# Patient Record
Sex: Female | Born: 1990 | Race: Black or African American | Hispanic: No | Marital: Single | Smoking: Never smoker
Health system: Southern US, Community
[De-identification: ages and names within clinical notes are randomized; demographics above are authoritative.]

## PROBLEM LIST (undated history)

## (undated) ENCOUNTER — Inpatient Hospital Stay (HOSPITAL_COMMUNITY): Payer: Self-pay

## (undated) DIAGNOSIS — F419 Anxiety disorder, unspecified: Secondary | ICD-10-CM

## (undated) HISTORY — PX: NO PAST SURGERIES: SHX2092

## (undated) HISTORY — DX: Anxiety disorder, unspecified: F41.9

---

## 2016-09-18 NOTE — L&D Delivery Note (Signed)
Patient complete and pushing. Feet visible at introitus. Pulled x 1 contraction with delivery of feet and hips. Rotated to chest down. Continued pushing led to delivery of shoulders and arms. Head delivered easily to mom's chest. SVD of viable female infant over intact perineum. Infant delivered to mom's abdomen. Delayed cord clamping x 1 minute. Cord clamped x 2, cut. Spontaneous cry heard. Weight and Apgars pending. Cord blood obtained. Placenta delivered with slight cord avulsion and manual extraction done. Uterus firm and minimal blood loss. Appeared intact with 3-VC. LUS cleared of clot Vagina inspected.no lacerations noted. Repaired with Vicryl Rapide EBL: 250 cc Anesthesia: IV push

## 2017-01-27 ENCOUNTER — Emergency Department (HOSPITAL_COMMUNITY): Payer: Self-pay

## 2017-01-27 ENCOUNTER — Emergency Department (HOSPITAL_COMMUNITY)
Admission: EM | Admit: 2017-01-27 | Discharge: 2017-01-28 | Discharge status: Against Medical Advice | Payer: Self-pay | Attending: Emergency Medicine | Admitting: Emergency Medicine

## 2017-01-27 DIAGNOSIS — O26891 Other specified pregnancy related conditions, first trimester: Secondary | ICD-10-CM | POA: Insufficient documentation

## 2017-01-27 DIAGNOSIS — O99341 Other mental disorders complicating pregnancy, first trimester: Secondary | ICD-10-CM | POA: Insufficient documentation

## 2017-01-27 DIAGNOSIS — Z91040 Latex allergy status: Secondary | ICD-10-CM | POA: Insufficient documentation

## 2017-01-27 DIAGNOSIS — R1030 Lower abdominal pain, unspecified: Secondary | ICD-10-CM | POA: Insufficient documentation

## 2017-01-27 DIAGNOSIS — O0281 Inappropriate change in quantitative human chorionic gonadotropin (hCG) in early pregnancy: Secondary | ICD-10-CM | POA: Insufficient documentation

## 2017-01-27 DIAGNOSIS — F410 Panic disorder [episodic paroxysmal anxiety] without agoraphobia: Secondary | ICD-10-CM | POA: Insufficient documentation

## 2017-01-27 LAB — COMPREHENSIVE METABOLIC PANEL
ALK PHOS: 48 U/L (ref 38–126)
ALT: 11 U/L — ABNORMAL LOW (ref 14–54)
ANION GAP: 9 (ref 5–15)
AST: 15 U/L (ref 15–41)
Albumin: 4.5 g/dL (ref 3.5–5.0)
BILIRUBIN TOTAL: 0.8 mg/dL (ref 0.3–1.2)
BUN: 12 mg/dL (ref 6–20)
CALCIUM: 9.4 mg/dL (ref 8.9–10.3)
CO2: 20 mmol/L — ABNORMAL LOW (ref 22–32)
Chloride: 105 mmol/L (ref 101–111)
Creatinine, Ser: 0.6 mg/dL (ref 0.44–1.00)
Glucose, Bld: 80 mg/dL (ref 65–99)
POTASSIUM: 3.5 mmol/L (ref 3.5–5.1)
Sodium: 134 mmol/L — ABNORMAL LOW (ref 135–145)
TOTAL PROTEIN: 7.3 g/dL (ref 6.5–8.1)

## 2017-01-27 LAB — CBC WITH DIFFERENTIAL/PLATELET
BASOS PCT: 0 %
Basophils Absolute: 0 10*3/uL (ref 0.0–0.1)
Eosinophils Absolute: 0.1 10*3/uL (ref 0.0–0.7)
Eosinophils Relative: 1 %
HCT: 31.8 % — ABNORMAL LOW (ref 36.0–46.0)
Hemoglobin: 10.8 g/dL — ABNORMAL LOW (ref 12.0–15.0)
Lymphocytes Relative: 25 %
Lymphs Abs: 1.6 10*3/uL (ref 0.7–4.0)
MCH: 28.3 pg (ref 26.0–34.0)
MCHC: 34 g/dL (ref 30.0–36.0)
MCV: 83.2 fL (ref 78.0–100.0)
MONO ABS: 0.4 10*3/uL (ref 0.1–1.0)
MONOS PCT: 6 %
NEUTROS ABS: 4.4 10*3/uL (ref 1.7–7.7)
Neutrophils Relative %: 68 %
Platelets: 194 10*3/uL (ref 150–400)
RBC: 3.82 MIL/uL — ABNORMAL LOW (ref 3.87–5.11)
RDW: 13.8 % (ref 11.5–15.5)
WBC: 6.4 10*3/uL (ref 4.0–10.5)

## 2017-01-27 LAB — URINALYSIS, ROUTINE W REFLEX MICROSCOPIC
BILIRUBIN URINE: NEGATIVE
GLUCOSE, UA: NEGATIVE mg/dL
Hgb urine dipstick: NEGATIVE
KETONES UR: 20 mg/dL — AB
LEUKOCYTES UA: NEGATIVE
Nitrite: NEGATIVE
PH: 6 (ref 5.0–8.0)
Protein, ur: NEGATIVE mg/dL
Specific Gravity, Urine: 1.005 (ref 1.005–1.030)

## 2017-01-27 LAB — HCG, QUANTITATIVE, PREGNANCY: hCG, Beta Chain, Quant, S: 44345 m[IU]/mL — ABNORMAL HIGH (ref <5)

## 2017-01-27 LAB — WET PREP, GENITAL
Sperm: NONE SEEN
Yeast Wet Prep HPF POC: NONE SEEN

## 2017-01-27 LAB — ABO/RH: ABO/RH(D): B POS

## 2017-01-27 LAB — LIPASE, BLOOD: Lipase: 15 U/L (ref 11–51)

## 2017-01-27 MED ORDER — SODIUM CHLORIDE 0.9 % IV BOLUS (SEPSIS)
1000.0000 mL | Freq: Once | INTRAVENOUS | Status: AC
  Administered 2017-01-27: 1000 mL via INTRAVENOUS

## 2017-01-27 NOTE — ED Provider Notes (Signed)
[facility]-EMERGENCY DEPT Provider Note   CSN: [id number] Arrival date & time: 01/27/17  1935     History   Chief Complaint Chief Complaint  Patient presents with  . Abdominal Pain  . Panic Attack    HPI Mandy Vasquez is a 26 y.o. female.  HPI 26 year old African-American female who is G3 P2 that presents to the emergency department today by EMS with complaints of a anxiety attack while at work preceded by lower abdominal cramping and pressure. Patient states that she was at work today when she developed severe lower abdominal cramping and gas pains which made her very upset and anxious because she was worried about the baby. States that she stood up quickly and had a brief syncopal episode. Denies hitting her head. She was out for a few seconds and regained consciousness. States that she does have a history of anxiety and panic attacks and has had syncopal episodes with these in the past and is followed by her primary care doctor and states that she does need medication for this. The patient reports mild nausea but denies any emesis. States that she has been nauseous with her pregnancy thus far. Reports constipation last bowel movement 1 week ago but endorses passing gas. History of same. States that she did take a pregnancy test last week that was positive. States that she thinks her last menstrual period cycle was the beginning of February but she is not sure. Patient states she is sexually active with last sexual intercourse was last night. States that she has been spotting over the past week but denies any spotting or vaginal discharge at this time. Patient denies any recent illness. She denies any headache, vision changes, fevers, lightheadedness, dizziness, chest pain, shortness of breath, urinary symptoms. Patient has not seen an OB/GYN as of yet. States that her first pregnant was normal. Does report having to be admitted to the ICU for blood and infection during her second pregnancy. No  past medical history on file.  There are no active problems to display for this patient.   No past surgical history on file.  OB History    No data available       Home Medications    Prior to Admission medications   Not on File    Family History No family history on file.  Social History Social History  Substance Use Topics  . Smoking status: Not on file  . Smokeless tobacco: Not on file  . Alcohol use Not on file     Allergies   Latex and Onion   Review of Systems Review of Systems  Constitutional: Negative for chills and fever.  HENT: Negative for congestion.   Eyes: Negative for visual disturbance.  Respiratory: Negative for cough and shortness of breath.   Cardiovascular: Negative for chest pain, palpitations and leg swelling.  Gastrointestinal: Positive for constipation. Negative for blood in stool, diarrhea, nausea and vomiting.  Genitourinary: Positive for vaginal bleeding (spotting). Negative for dysuria, flank pain, frequency, hematuria, urgency and vaginal discharge.  Skin: Negative.   Neurological: Positive for syncope. Negative for dizziness, seizures, weakness, light-headedness, numbness and headaches.  Psychiatric/Behavioral: The patient is nervous/anxious.      Physical Exam Updated Vital Signs BP 115/63 (BP Location: Left Arm)   Pulse 68   Temp 98.5 F (36.9 C) (Oral)   Resp 19   SpO2 100%   Physical Exam  Constitutional: She is oriented to person, place, and time. She appears well-developed and well-nourished. No distress.  Non toxic appearing.   HENT:  Head: Normocephalic and atraumatic.  Mouth/Throat: Oropharynx is clear and moist.  Eyes: Conjunctivae and EOM are normal. Pupils are equal, round, and reactive to light. Right eye exhibits no discharge. Left eye exhibits no discharge. No scleral icterus.  Neck: Normal range of motion. Neck supple. No thyromegaly present.  Cardiovascular: Normal rate, regular rhythm, normal heart  sounds and intact distal pulses.  Exam reveals no gallop and no friction rub.   No murmur heard. Pulmonary/Chest: Effort normal and breath sounds normal. No respiratory distress. She has no wheezes. She has no rales. She exhibits no tenderness.  Abdominal: Soft. Bowel sounds are normal. She exhibits no distension. There is tenderness in the right lower quadrant, suprapubic area and left lower quadrant. There is no rigidity, no rebound, no guarding and no CVA tenderness.  Genitourinary:  Genitourinary Comments: Chaperone present for exam. Small amount of white discharge noted. The patient has tenderness over the bilateral adnexa is cervix. Cervical os is closed. No bleeding noted. Cervix is not friable.  Musculoskeletal: Normal range of motion.  Lymphadenopathy:    She has no cervical adenopathy.  Neurological: She is alert and oriented to person, place, and time.  The patient is alert, attentive, and oriented x 3. Speech is clear. Cranial nerve II-VII grossly intact. Negative pronator drift. Sensation intact. Strength 5/5 in all extremities. Reflexes 2+ and symmetric at biceps, triceps, knees, and ankles. Rapid alternating movement and fine finger movements intact.    Skin: Skin is warm and dry. Capillary refill takes less than 2 seconds.  Nursing note and vitals reviewed.    ED Treatments / Results  Labs (all labs ordered are listed, but only abnormal results are displayed) Labs Reviewed  WET PREP, GENITAL - Abnormal; Notable for the following:       Result Value   Trich, Wet Prep MANY (*)    Clue Cells Wet Prep HPF POC PRESENT (*)    WBC, Wet Prep HPF POC MANY (*)    All other components within normal limits  COMPREHENSIVE METABOLIC PANEL - Abnormal; Notable for the following:    Sodium 134 (*)    CO2 20 (*)    ALT 11 (*)    All other components within normal limits  URINALYSIS, ROUTINE W REFLEX MICROSCOPIC - Abnormal; Notable for the following:    Color, Urine STRAW (*)     Ketones, ur 20 (*)    All other components within normal limits  CBC WITH DIFFERENTIAL/PLATELET - Abnormal; Notable for the following:    RBC 3.82 (*)    Hemoglobin 10.8 (*)    HCT 31.8 (*)    All other components within normal limits  HCG, QUANTITATIVE, PREGNANCY - Abnormal; Notable for the following:    hCG, Beta Chain, Quant, S 44,345 (*)    All other components within normal limits  LIPASE, BLOOD  ABO/RH  GC/CHLAMYDIA PROBE AMP ([place]) NOT AT [facility]    EKG  EKG Interpretation  Date/Time:  Saturday Jan 27 2017 21:38:25 EDT Ventricular Rate:  66 PR Interval:    QRS Duration: 68 QT Interval:  438 QTC Calculation: 459 R Axis:   86 Text Interpretation:  Sinus rhythm Atrial premature complex Probable left atrial enlargement No old tracing to compare Confirmed by BELFI  MD, MELANIE ([id number]) on 01/27/2017 9:48:30 PM       Radiology No results found.  Procedures Procedures (including critical care time)  Medications Ordered in ED Medications  sodium chloride  0.9 % bolus 1,000 mL (0 mLs Intravenous Stopped 01/27/17 2234)     Initial Impression / Assessment and Plan / ED Course  I have reviewed the triage vital signs and the nursing notes.  Pertinent labs & imaging results that were available during my care of the patient were reviewed by me and considered in my medical decision making (see chart for details).     Patient presents to the emergency Department today with complaints of lower abdominal cramping and vaginal spotting over the past week. This was followed by a anxiety/panic attack cultured have a syncopal episode when she stood up quickly at work. Denies head trauma. Patient states that she has a history of anxiety and panic attacks causing her to have a syncopal episodes. Patient seems very concerned about the cramping and bleeding. On exam patient does have a lateral adnexal tenderness and cervical tenderness. No bleeding was noted. She does have tenderness to  lower abdomen on exam. Patient has no leukocytosis. Hemoglobin is 10.8 which patient states has a history of anemia. Do not feel this is causing her symptoms. Electrolytes are normal. Patient is B+ is not a program. UA shows no signs of infection. Wet prep does show trichomoniasis present include cells with many WBCs. GC and chlamydia cultures are pending. The patient needs to be treated with Flagyl however unsure patient has her first or second trimester. Also can't r/o pid but wuld like to wait for results before treating. Pt will need close follow up with ob/'gyn. Awaiting ultrasound results. The patient was given IV fluids. EKG shows no signs of ischemic changes or right heart strain. Patient has no lower extremity edema. Doubt PE. This time patient is awaiting ultrasound results. Sign out to PA Upstill following ultrasound results and reassessment the patient. If ultrasound is normal with live intrauterine pregnancy feel the patient needs close follow-up with OB/GYN and can be discharged with strict return precautions. Dicussed with Dr. Fredderick Phenix who is agreeable. Patient is currently hemodynamically stable. Vital signs are normal. No hypotension.  Final Clinical Impressions(s) / ED Diagnoses   Final diagnoses:  None    New Prescriptions New Prescriptions   No medications on file     Wallace Keller 01/27/17 2356    Rolan Bucco, MD 01/28/17 [phone]

## 2017-01-27 NOTE — ED Triage Notes (Addendum)
Per  EMS with complaint of panic attack/anxiety while at work today  and abdominal pain x1 week . Pt. [redacted] weeks pregnant ,also reported of random spotting  over a week now. Pain at R/L  Lower quadrant. G3P2. Reported of N/V prior to coming to ED. Pt. Also report of constipation , last BM a week and half ago.

## 2017-01-27 NOTE — ED Notes (Signed)
Bed: WA21 Expected date:  Expected time:  Means of arrival:  Comments: 26 yo pregnant/abd pain x1 week

## 2017-01-27 NOTE — ED Provider Notes (Cosign Needed)
?  12-week pregnant with lower abd cramping started today G3P2 Spotting in the last week - none today Passed out today with standing up H/O anxiety - ? Vasovagal response Today has tenderness throughout pelvis - Hgb 10.8, no leukocytosis Trich positive - GC/chlamydia pending Quant 44000 - US pending  2:15 - Patient appears to have left the department without informing staff. IV was removed during encounter by patient request secondary to discomfort. US incomplete prior to her departure. AMA disposition set.     Elpidio AnisUpstill, Belkis Norbeck, PA-C 01/28/17 0216

## 2017-01-28 MED ORDER — ACETAMINOPHEN 325 MG PO TABS
650.0000 mg | ORAL_TABLET | Freq: Once | ORAL | Status: AC
  Administered 2017-01-28: 650 mg via ORAL
  Filled 2017-01-28: qty 2

## 2017-01-28 NOTE — ED Notes (Signed)
Pt. Left AMA. Melvenia BeamShari ,PA notified.

## 2017-01-29 LAB — GC/CHLAMYDIA PROBE AMP (~~LOC~~) NOT AT ARMC
CHLAMYDIA, DNA PROBE: NEGATIVE
Neisseria Gonorrhea: NEGATIVE

## 2017-03-16 ENCOUNTER — Encounter: Payer: Self-pay | Admitting: Obstetrics

## 2017-04-02 ENCOUNTER — Other Ambulatory Visit (HOSPITAL_COMMUNITY)
Admission: RE | Admit: 2017-04-02 | Discharge: 2017-04-02 | Disposition: A | Discharge status: Home / Self Care | Payer: Medicaid Other | Source: Ambulatory Visit | Attending: Certified Nurse Midwife | Admitting: Certified Nurse Midwife

## 2017-04-02 ENCOUNTER — Encounter: Payer: Self-pay | Admitting: Certified Nurse Midwife

## 2017-04-02 ENCOUNTER — Ambulatory Visit (INDEPENDENT_AMBULATORY_CARE_PROVIDER_SITE_OTHER): Payer: Medicaid Other | Admitting: Certified Nurse Midwife

## 2017-04-02 VITALS — BP 107/69 | HR 58 | Ht 70.0 in | Wt 128.8 lb

## 2017-04-02 DIAGNOSIS — O09899 Supervision of other high risk pregnancies, unspecified trimester: Secondary | ICD-10-CM | POA: Insufficient documentation

## 2017-04-02 DIAGNOSIS — Z3A23 23 weeks gestation of pregnancy: Secondary | ICD-10-CM | POA: Insufficient documentation

## 2017-04-02 DIAGNOSIS — O09212 Supervision of pregnancy with history of pre-term labor, second trimester: Secondary | ICD-10-CM | POA: Diagnosis not present

## 2017-04-02 DIAGNOSIS — Z3482 Encounter for supervision of other normal pregnancy, second trimester: Secondary | ICD-10-CM | POA: Insufficient documentation

## 2017-04-02 DIAGNOSIS — O0930 Supervision of pregnancy with insufficient antenatal care, unspecified trimester: Secondary | ICD-10-CM | POA: Insufficient documentation

## 2017-04-02 DIAGNOSIS — O0990 Supervision of high risk pregnancy, unspecified, unspecified trimester: Secondary | ICD-10-CM | POA: Insufficient documentation

## 2017-04-02 DIAGNOSIS — O09219 Supervision of pregnancy with history of pre-term labor, unspecified trimester: Principal | ICD-10-CM

## 2017-04-02 DIAGNOSIS — O0932 Supervision of pregnancy with insufficient antenatal care, second trimester: Secondary | ICD-10-CM

## 2017-04-02 DIAGNOSIS — Z3480 Encounter for supervision of other normal pregnancy, unspecified trimester: Secondary | ICD-10-CM

## 2017-04-02 MED ORDER — PROGESTERONE MICRONIZED 200 MG PO CAPS: 200.0000 mg | ORAL_CAPSULE | Freq: Every day | ORAL | 1 refills | Status: DC

## 2017-04-02 NOTE — Progress Notes (Signed)
Patient is in the office for initial ob visit, reports good fetal movement, denies pain today, but states that she has been experiencing irregular contractions when she is on her feet at work.

## 2017-04-02 NOTE — Progress Notes (Signed)
Subjective:  Mandy Vasquez is a 26 y.o. [id number] at 4184w4d being seen today for first prenatal care visit.  She is currently monitored for the following issues for this low-risk pregnancy and has Supervision of other normal pregnancy, antepartum; History of preterm delivery, currently pregnant; and Late prenatal care affecting pregnancy on her problem list.  Patient reports no complaints.  Contractions: Not present. Vag. Bleeding: None.  Movement: Present. Denies leaking of fluid.   The following portions of the patient's history were reviewed and updated as appropriate: allergies, current medications, past family history, past medical history, past social history, past surgical history and problem list. Problem list updated.  Objective:   Vitals:   04/02/17 1023 04/02/17 1026  BP: 107/69   Pulse: (!) 58   Weight: 128 lb 12.8 oz (58.4 kg)   Height:  5\' 10"  (1.778 m)    Fetal Status:     Movement: Present     General:  Alert, oriented and cooperative. Patient is in no acute distress.  Skin: Skin is warm and dry. No rash noted.   Cardiovascular: Normal heart rate noted  Respiratory: Normal respiratory effort, no problems with respiration noted  Abdomen: Soft, gravid, appropriate for gestational age. Pain/Pressure: Absent     Pelvic: Vag. Bleeding: None     Cervical exam performed        Extremities: Normal range of motion.  Edema: None  Mental Status: Normal mood and affect. Normal behavior. Normal judgment and thought content.   Urinalysis:      Assessment and Plan:  Pregnancy: [id number] at 5184w4d  1. Supervision of other normal pregnancy, antepartum - Cytology - PAP - Cervicovaginal ancillary only - Obstetric Panel, Including HIV - Vitamin D (25 hydroxy) - Hemoglobin A1c - Hemoglobinopathy evaluation - Varicella zoster antibody, IgG - Culture, OB Urine - US MFM OB DETAIL +14 WK; Future  2. History of preterm delivery, currently pregnant - 34 wks SROM >PTD - start vaginal  Progesterone  3. Late prenatal care affecting pregnancy in second trimester   Preterm labor symptoms and general obstetric precautions including but not limited to vaginal bleeding, contractions, leaking of fluid and fetal movement were reviewed in detail with the patient. Please refer to After Visit Summary for other counseling recommendations.  Return in about 4 weeks (around 04/30/2017).   Donette LarryBhambri, Mandy Vasquez, CNM

## 2017-04-02 NOTE — Addendum Note (Signed)
Addended by: Natale MilchSTALLING, Takeia Ciaravino D on: 04/02/2017 02:13 PM   Modules accepted: Orders

## 2017-04-03 LAB — CERVICOVAGINAL ANCILLARY ONLY
Bacterial vaginitis: POSITIVE — AB
Candida vaginitis: POSITIVE — AB
Chlamydia: NEGATIVE
NEISSERIA GONORRHEA: NEGATIVE
TRICH (WINDOWPATH): POSITIVE — AB

## 2017-04-03 LAB — CYTOLOGY - PAP
DIAGNOSIS: NEGATIVE
HPV (WINDOPATH): NOT DETECTED

## 2017-04-04 LAB — URINE CULTURE, OB REFLEX

## 2017-04-04 LAB — CULTURE, OB URINE

## 2017-04-05 ENCOUNTER — Telehealth: Payer: Self-pay | Admitting: *Deleted

## 2017-04-05 NOTE — Telephone Encounter (Signed)
LM on VM to CB re: lab results. 

## 2017-04-05 NOTE — Telephone Encounter (Signed)
-----   Message from Donette LarryMelanie Bhambri, [place] sent at 04/04/2017  8:59 AM EDT ----- Regarding: results +BV, yeast, and trich. Please notify pt and send Flagyl 2gm x1 and Terazol 7. Needs to abstain from sex for 2 weeks and partner needs to seek treatment. Thanks.

## 2017-04-05 NOTE — Telephone Encounter (Signed)
LM on VM to CB re: medication sent to pharmacy and instruction on usage.

## 2017-04-05 NOTE — Telephone Encounter (Signed)
-----   Message from Donette LarryMelanie Bhambri, [place] sent at 04/02/2017  1:08 PM EDT ----- Regarding: Prometrium Please inform patient that I recommend vaginal progesterone to decrease her risk of another preterm delivery. She should use it nightly from now until 36 weeks. I sent Rx. Thanks.

## 2017-04-06 LAB — OBSTETRIC PANEL, INCLUDING HIV
ANTIBODY SCREEN: NEGATIVE
BASOS ABS: 0 10*3/uL (ref 0.0–0.2)
BASOS: 0 %
EOS (ABSOLUTE): 0.2 10*3/uL (ref 0.0–0.4)
Eos: 2 %
HEMATOCRIT: 36.1 % (ref 34.0–46.6)
HIV SCREEN 4TH GENERATION: NONREACTIVE
Hemoglobin: 11 g/dL — ABNORMAL LOW (ref 11.1–15.9)
Hepatitis B Surface Ag: NEGATIVE
Immature Grans (Abs): 0 10*3/uL (ref 0.0–0.1)
Immature Granulocytes: 0 %
Lymphocytes Absolute: 2 10*3/uL (ref 0.7–3.1)
Lymphs: 26 %
MCH: 26.7 pg (ref 26.6–33.0)
MCHC: 30.5 g/dL — AB (ref 31.5–35.7)
MCV: 88 fL (ref 79–97)
MONOCYTES: 7 %
MONOS ABS: 0.5 10*3/uL (ref 0.1–0.9)
Neutrophils Absolute: 5 10*3/uL (ref 1.4–7.0)
Neutrophils: 65 %
PLATELETS: 247 10*3/uL (ref 150–379)
RBC: 4.12 x10E6/uL (ref 3.77–5.28)
RDW: 13.6 % (ref 12.3–15.4)
RPR Ser Ql: NONREACTIVE
RUBELLA: 1.64 {index} (ref >0.99)
Rh Factor: POSITIVE
WBC: 7.7 10*3/uL (ref 3.4–10.8)

## 2017-04-06 LAB — HEMOGLOBINOPATHY EVALUATION
HEMOGLOBIN A2 QUANTITATION: 2.3 % (ref 1.8–3.2)
HGB C: 0 %
HGB S: 0 %
HGB VARIANT: 0 %
Hemoglobin F Quantitation: 0 % (ref 0.0–2.0)
Hgb A: 97.7 % (ref 96.4–98.8)

## 2017-04-06 LAB — HEMOGLOBIN A1C
Est. average glucose Bld gHb Est-mCnc: 82 mg/dL
Hgb A1c MFr Bld: 4.5 % — ABNORMAL LOW (ref 4.8–5.6)

## 2017-04-06 LAB — VITAMIN D 25 HYDROXY (VIT D DEFICIENCY, FRACTURES): VIT D 25 HYDROXY: 31.7 ng/mL (ref 30.0–100.0)

## 2017-04-06 LAB — VARICELLA ZOSTER ANTIBODY, IGG: VARICELLA: 2695 {index} (ref >165)

## 2017-04-11 LAB — CYSTIC FIBROSIS MUTATION 97: Interpretation: NOT DETECTED

## 2017-04-12 ENCOUNTER — Other Ambulatory Visit: Payer: Self-pay | Admitting: Certified Nurse Midwife

## 2017-04-12 ENCOUNTER — Ambulatory Visit (HOSPITAL_COMMUNITY)
Admission: RE | Admit: 2017-04-12 | Discharge: 2017-04-12 | Disposition: A | Discharge status: Home / Self Care | Payer: Medicaid Other | Source: Ambulatory Visit | Attending: Certified Nurse Midwife | Admitting: Certified Nurse Midwife

## 2017-04-12 ENCOUNTER — Other Ambulatory Visit: Payer: Medicaid Other

## 2017-04-12 DIAGNOSIS — O23599 Infection of other part of genital tract in pregnancy, unspecified trimester: Secondary | ICD-10-CM

## 2017-04-12 DIAGNOSIS — O321XX0 Maternal care for breech presentation, not applicable or unspecified: Secondary | ICD-10-CM | POA: Diagnosis not present

## 2017-04-12 DIAGNOSIS — Z3A25 25 weeks gestation of pregnancy: Secondary | ICD-10-CM

## 2017-04-12 DIAGNOSIS — O09213 Supervision of pregnancy with history of pre-term labor, third trimester: Secondary | ICD-10-CM | POA: Diagnosis not present

## 2017-04-12 DIAGNOSIS — Z3492 Encounter for supervision of normal pregnancy, unspecified, second trimester: Secondary | ICD-10-CM

## 2017-04-12 DIAGNOSIS — Z3480 Encounter for supervision of other normal pregnancy, unspecified trimester: Secondary | ICD-10-CM | POA: Diagnosis present

## 2017-04-12 DIAGNOSIS — Z363 Encounter for antenatal screening for malformations: Secondary | ICD-10-CM

## 2017-04-12 DIAGNOSIS — Z3A22 22 weeks gestation of pregnancy: Secondary | ICD-10-CM | POA: Diagnosis not present

## 2017-04-12 DIAGNOSIS — A5901 Trichomonal vulvovaginitis: Secondary | ICD-10-CM

## 2017-04-12 DIAGNOSIS — O23592 Infection of other part of genital tract in pregnancy, second trimester: Secondary | ICD-10-CM

## 2017-04-12 DIAGNOSIS — Z8751 Personal history of pre-term labor: Secondary | ICD-10-CM

## 2017-04-12 MED ORDER — METRONIDAZOLE 500 MG PO TABS: ORAL_TABLET | ORAL | 0 refills | Status: DC

## 2017-04-12 MED ORDER — TERCONAZOLE 0.4 % VA CREA: 1.0000 | TOPICAL_CREAM | Freq: Every day | VAGINAL | 0 refills | Status: DC

## 2017-04-12 NOTE — Addendum Note (Signed)
Addended by: Donette LarryBHAMBRI, Joclynn Lumb E on: 04/12/2017 12:29 PM   Modules accepted: Kipp BroodSmartSet

## 2017-04-12 NOTE — Progress Notes (Signed)
Patient came in the office, notified of lab results and rx sent, pt was extremely upset about results and stated that she would call back to re-schedule appt for glucose test, due to vomiting.

## 2017-04-13 LAB — CBC
HEMOGLOBIN: 11.1 g/dL (ref 11.1–15.9)
Hematocrit: 35.8 % (ref 34.0–46.6)
MCH: 28 pg (ref 26.6–33.0)
MCHC: 31 g/dL — AB (ref 31.5–35.7)
MCV: 90 fL (ref 79–97)
Platelets: 203 10*3/uL (ref 150–379)
RBC: 3.97 x10E6/uL (ref 3.77–5.28)
RDW: 13.8 % (ref 12.3–15.4)
WBC: 8.5 10*3/uL (ref 3.4–10.8)

## 2017-04-13 LAB — HIV ANTIBODY (ROUTINE TESTING W REFLEX): HIV SCREEN 4TH GENERATION: NONREACTIVE

## 2017-04-13 LAB — RPR: RPR Ser Ql: NONREACTIVE

## 2017-04-13 NOTE — Telephone Encounter (Signed)
Patient notified at her appointment in office 7/26

## 2017-04-25 ENCOUNTER — Other Ambulatory Visit (INDEPENDENT_AMBULATORY_CARE_PROVIDER_SITE_OTHER): Payer: Medicaid Other | Admitting: Certified Nurse Midwife

## 2017-04-25 DIAGNOSIS — O99612 Diseases of the digestive system complicating pregnancy, second trimester: Secondary | ICD-10-CM

## 2017-04-25 DIAGNOSIS — O99342 Other mental disorders complicating pregnancy, second trimester: Secondary | ICD-10-CM

## 2017-04-25 DIAGNOSIS — O219 Vomiting of pregnancy, unspecified: Secondary | ICD-10-CM

## 2017-04-25 DIAGNOSIS — O09212 Supervision of pregnancy with history of pre-term labor, second trimester: Secondary | ICD-10-CM

## 2017-04-25 DIAGNOSIS — F419 Anxiety disorder, unspecified: Secondary | ICD-10-CM

## 2017-04-25 DIAGNOSIS — K219 Gastro-esophageal reflux disease without esophagitis: Secondary | ICD-10-CM

## 2017-04-25 DIAGNOSIS — O99340 Other mental disorders complicating pregnancy, unspecified trimester: Secondary | ICD-10-CM

## 2017-04-25 DIAGNOSIS — O09899 Supervision of other high risk pregnancies, unspecified trimester: Secondary | ICD-10-CM

## 2017-04-25 DIAGNOSIS — O09219 Supervision of pregnancy with history of pre-term labor, unspecified trimester: Principal | ICD-10-CM

## 2017-04-25 MED ORDER — PRENATE PIXIE 10-0.6-0.4-200 MG PO CAPS: 1.0000 | ORAL_CAPSULE | Freq: Every day | ORAL | 12 refills | Status: DC

## 2017-04-25 MED ORDER — OMEPRAZOLE 20 MG PO CPDR: 20.0000 mg | DELAYED_RELEASE_CAPSULE | Freq: Two times a day (BID) | ORAL | 5 refills | Status: DC

## 2017-04-25 MED ORDER — HYDROXYZINE HCL 25 MG PO TABS: 25.0000 mg | ORAL_TABLET | Freq: Four times a day (QID) | ORAL | 3 refills | Status: DC | PRN

## 2017-04-25 MED ORDER — DOXYLAMINE-PYRIDOXINE ER 20-20 MG PO TBCR: 1.0000 | EXTENDED_RELEASE_TABLET | Freq: Two times a day (BID) | ORAL | 6 refills | Status: DC

## 2017-04-25 MED ORDER — HYDROXYPROGESTERONE CAPROATE 275 MG/1.1ML ~~LOC~~ SOAJ: 275.0000 mg | Freq: Once | SUBCUTANEOUS | Status: DC

## 2017-04-25 MED ORDER — HYDROXYPROGESTERONE CAPROATE 250 MG/ML IM OIL
250.0000 mg | TOPICAL_OIL | Freq: Once | INTRAMUSCULAR | Status: AC
  Administered 2017-04-25: 250 mg via INTRAMUSCULAR

## 2017-04-25 MED ORDER — ONDANSETRON 4 MG PO TBDP: 8.0000 mg | ORAL_TABLET | Freq: Four times a day (QID) | ORAL | 4 refills | Status: AC | PRN

## 2017-04-25 NOTE — Progress Notes (Signed)
Patient is in the office for second attempt glucose testing. She started vomiting and shaking after attempting her glucose test. She was taken to an exam room for monitoring and rest after she was able to ambulate. She was having chest pain which she stated she has with her anxiety attacks. BP reading 90/50 O2 sat 99 BP reading 87/59 Patient is resting drinking orange juice and eating crackers. She feels sure that her chest pain was from her anxiety- she has had this before. BP reading 91/60 P 68 O2 sat 99

## 2017-04-26 LAB — GLUCOSE, RANDOM: Glucose: 68 mg/dL (ref 65–99)

## 2017-04-27 NOTE — Progress Notes (Signed)
S: patient improved after an hour of resting, denies any chest pain currently.  O: incomplete second attempt at 2 hour OGTT A: unable to tolerate 2 hour OGTT, needs 1 hour jelly bean test P: f/u 1 hour jelly bean test at 28 weeks. Start 17-P for hx of PTD.

## 2017-04-30 ENCOUNTER — Other Ambulatory Visit: Payer: Self-pay | Admitting: *Deleted

## 2017-04-30 DIAGNOSIS — O219 Vomiting of pregnancy, unspecified: Secondary | ICD-10-CM

## 2017-04-30 MED ORDER — DOXYLAMINE-PYRIDOXINE 10-10 MG PO TBEC: 1.0000 | DELAYED_RELEASE_TABLET | Freq: Three times a day (TID) | ORAL | 99 refills | Status: DC

## 2017-05-02 ENCOUNTER — Encounter: Payer: Medicaid Other | Admitting: Obstetrics & Gynecology

## 2017-05-14 ENCOUNTER — Other Ambulatory Visit: Payer: Medicaid Other

## 2017-05-14 ENCOUNTER — Encounter: Payer: Medicaid Other | Admitting: Advanced Practice Midwife

## 2017-06-05 ENCOUNTER — Telehealth: Payer: Self-pay | Admitting: *Deleted

## 2017-06-05 NOTE — Telephone Encounter (Signed)
Left vmail for patient to call back to schedule appt.

## 2017-06-08 ENCOUNTER — Other Ambulatory Visit: Payer: Self-pay

## 2017-07-31 ENCOUNTER — Inpatient Hospital Stay (HOSPITAL_COMMUNITY)
Admission: AD | Admit: 2017-07-31 | Discharge: 2017-08-02 | DRG: 807 | Disposition: A | Discharge status: Home / Self Care | Payer: Medicaid Other | Source: Ambulatory Visit | Attending: Family Medicine | Admitting: Family Medicine

## 2017-07-31 ENCOUNTER — Other Ambulatory Visit: Payer: Self-pay

## 2017-07-31 ENCOUNTER — Encounter (HOSPITAL_COMMUNITY): Payer: Self-pay | Admitting: *Deleted

## 2017-07-31 DIAGNOSIS — O9089 Other complications of the puerperium, not elsewhere classified: Secondary | ICD-10-CM | POA: Diagnosis not present

## 2017-07-31 DIAGNOSIS — Z3A38 38 weeks gestation of pregnancy: Secondary | ICD-10-CM | POA: Diagnosis not present

## 2017-07-31 DIAGNOSIS — R51 Headache: Secondary | ICD-10-CM | POA: Diagnosis not present

## 2017-07-31 DIAGNOSIS — O321XX0 Maternal care for breech presentation, not applicable or unspecified: Principal | ICD-10-CM | POA: Diagnosis present

## 2017-07-31 DIAGNOSIS — O321XX1 Maternal care for breech presentation, fetus 1: Secondary | ICD-10-CM

## 2017-07-31 DIAGNOSIS — O0990 Supervision of high risk pregnancy, unspecified, unspecified trimester: Secondary | ICD-10-CM

## 2017-07-31 DIAGNOSIS — O09219 Supervision of pregnancy with history of pre-term labor, unspecified trimester: Secondary | ICD-10-CM

## 2017-07-31 DIAGNOSIS — Z91040 Latex allergy status: Secondary | ICD-10-CM

## 2017-07-31 DIAGNOSIS — Z3483 Encounter for supervision of other normal pregnancy, third trimester: Secondary | ICD-10-CM | POA: Diagnosis present

## 2017-07-31 DIAGNOSIS — O09899 Supervision of other high risk pregnancies, unspecified trimester: Secondary | ICD-10-CM

## 2017-07-31 DIAGNOSIS — O0932 Supervision of pregnancy with insufficient antenatal care, second trimester: Secondary | ICD-10-CM

## 2017-07-31 LAB — CBC
HCT: 33.7 % — ABNORMAL LOW (ref 36.0–46.0)
Hemoglobin: 11.2 g/dL — ABNORMAL LOW (ref 12.0–15.0)
MCH: 28.6 pg (ref 26.0–34.0)
MCHC: 33.2 g/dL (ref 30.0–36.0)
MCV: 86.2 fL (ref 78.0–100.0)
PLATELETS: 201 10*3/uL (ref 150–400)
RBC: 3.91 MIL/uL (ref 3.87–5.11)
RDW: 13.3 % (ref 11.5–15.5)
WBC: 9.9 10*3/uL (ref 4.0–10.5)

## 2017-07-31 LAB — TYPE AND SCREEN
ABO/RH(D): B POS
Antibody Screen: NEGATIVE

## 2017-07-31 LAB — RPR: RPR: NONREACTIVE

## 2017-07-31 LAB — POCT FERN TEST: POCT FERN TEST: POSITIVE

## 2017-07-31 LAB — ABO/RH: ABO/RH(D): B POS

## 2017-07-31 MED ORDER — LACTATED RINGERS IV SOLN
INTRAVENOUS | Status: DC
  Administered 2017-07-31: 01:00:00 via INTRAVENOUS

## 2017-07-31 MED ORDER — FLEET ENEMA 7-19 GM/118ML RE ENEM: 1.0000 | ENEMA | RECTAL | Status: DC | PRN

## 2017-07-31 MED ORDER — PHENYLEPHRINE 40 MCG/ML (10ML) SYRINGE FOR IV PUSH (FOR BLOOD PRESSURE SUPPORT)
80.0000 ug | PREFILLED_SYRINGE | INTRAVENOUS | Status: DC | PRN
  Filled 2017-07-31: qty 5

## 2017-07-31 MED ORDER — OXYCODONE HCL 5 MG PO TABS
5.0000 mg | ORAL_TABLET | ORAL | Status: DC | PRN
  Administered 2017-07-31 – 2017-08-01 (×7): 5 mg via ORAL
  Filled 2017-07-31 (×7): qty 1

## 2017-07-31 MED ORDER — OXYTOCIN 40 UNITS IN LACTATED RINGERS INFUSION - SIMPLE MED
2.5000 [IU]/h | INTRAVENOUS | Status: DC
  Administered 2017-07-31: 2.5 [IU]/h via INTRAVENOUS

## 2017-07-31 MED ORDER — EPHEDRINE 5 MG/ML INJ
10.0000 mg | INTRAVENOUS | Status: DC | PRN
  Filled 2017-07-31: qty 2

## 2017-07-31 MED ORDER — FENTANYL 2.5 MCG/ML BUPIVACAINE 1/10 % EPIDURAL INFUSION (WH - ANES)
INTRAMUSCULAR | Status: AC
  Filled 2017-07-31: qty 100

## 2017-07-31 MED ORDER — SIMETHICONE 80 MG PO CHEW: 80.0000 mg | CHEWABLE_TABLET | ORAL | Status: DC | PRN

## 2017-07-31 MED ORDER — OXYTOCIN 40 UNITS IN LACTATED RINGERS INFUSION - SIMPLE MED
INTRAVENOUS | Status: AC
  Filled 2017-07-31: qty 1000

## 2017-07-31 MED ORDER — PRENATAL MULTIVITAMIN CH
1.0000 | ORAL_TABLET | Freq: Every day | ORAL | Status: DC
Start: 2017-07-31 — End: 2017-07-31

## 2017-07-31 MED ORDER — DIBUCAINE 1 % RE OINT: 1.0000 "application " | TOPICAL_OINTMENT | RECTAL | Status: DC | PRN

## 2017-07-31 MED ORDER — LACTATED RINGERS IV SOLN: 500.0000 mL | INTRAVENOUS | Status: DC | PRN

## 2017-07-31 MED ORDER — PRENATAL MULTIVITAMIN CH
1.0000 | ORAL_TABLET | Freq: Every day | ORAL | Status: DC
  Administered 2017-07-31 – 2017-08-01 (×2): 1 via ORAL
  Filled 2017-07-31 (×3): qty 1

## 2017-07-31 MED ORDER — DIPHENHYDRAMINE HCL 25 MG PO CAPS
25.0000 mg | ORAL_CAPSULE | Freq: Four times a day (QID) | ORAL | Status: DC | PRN
Start: 2017-07-31 — End: 2017-08-02

## 2017-07-31 MED ORDER — PHENYLEPHRINE 40 MCG/ML (10ML) SYRINGE FOR IV PUSH (FOR BLOOD PRESSURE SUPPORT)
PREFILLED_SYRINGE | INTRAVENOUS | Status: AC
  Filled 2017-07-31: qty 10

## 2017-07-31 MED ORDER — ACETAMINOPHEN 325 MG PO TABS
650.0000 mg | ORAL_TABLET | ORAL | Status: DC | PRN
Start: 2017-07-31 — End: 2017-08-02
  Administered 2017-07-31 (×2): 650 mg via ORAL
  Filled 2017-07-31 (×2): qty 2

## 2017-07-31 MED ORDER — ONDANSETRON HCL 4 MG PO TABS: 4.0000 mg | ORAL_TABLET | ORAL | Status: DC | PRN

## 2017-07-31 MED ORDER — DIPHENHYDRAMINE HCL 50 MG/ML IJ SOLN: 12.5000 mg | INTRAMUSCULAR | Status: DC | PRN

## 2017-07-31 MED ORDER — COCONUT OIL OIL
1.0000 "application " | TOPICAL_OIL | Status: DC | PRN
  Administered 2017-07-31: 1 via TOPICAL
  Filled 2017-07-31: qty 120

## 2017-07-31 MED ORDER — ONDANSETRON HCL 4 MG/2ML IJ SOLN: 4.0000 mg | INTRAMUSCULAR | Status: DC | PRN

## 2017-07-31 MED ORDER — FENTANYL CITRATE (PF) 100 MCG/2ML IJ SOLN
INTRAMUSCULAR | Status: AC
  Filled 2017-07-31: qty 2

## 2017-07-31 MED ORDER — SOD CITRATE-CITRIC ACID 500-334 MG/5ML PO SOLN: 30.0000 mL | ORAL | Status: DC | PRN

## 2017-07-31 MED ORDER — SENNOSIDES-DOCUSATE SODIUM 8.6-50 MG PO TABS
2.0000 | ORAL_TABLET | ORAL | Status: DC
Start: 2017-08-01 — End: 2017-08-02
  Administered 2017-07-31: 2 via ORAL
  Filled 2017-07-31 (×2): qty 2

## 2017-07-31 MED ORDER — LIDOCAINE HCL (PF) 1 % IJ SOLN
INTRAMUSCULAR | Status: AC
  Filled 2017-07-31: qty 30

## 2017-07-31 MED ORDER — TETANUS-DIPHTH-ACELL PERTUSSIS 5-2.5-18.5 LF-MCG/0.5 IM SUSP: 0.5000 mL | Freq: Once | INTRAMUSCULAR | Status: DC

## 2017-07-31 MED ORDER — FENTANYL CITRATE (PF) 100 MCG/2ML IJ SOLN
50.0000 ug | Freq: Once | INTRAMUSCULAR | Status: AC
  Administered 2017-07-31: 50 ug via INTRAVENOUS

## 2017-07-31 MED ORDER — OXYCODONE-ACETAMINOPHEN 5-325 MG PO TABS: 2.0000 | ORAL_TABLET | ORAL | Status: DC | PRN

## 2017-07-31 MED ORDER — OXYCODONE-ACETAMINOPHEN 5-325 MG PO TABS: 1.0000 | ORAL_TABLET | ORAL | Status: DC | PRN

## 2017-07-31 MED ORDER — MEASLES, MUMPS & RUBELLA VAC ~~LOC~~ INJ
0.5000 mL | INJECTION | Freq: Once | SUBCUTANEOUS | Status: DC
  Filled 2017-07-31: qty 0.5

## 2017-07-31 MED ORDER — OXYTOCIN BOLUS FROM INFUSION
500.0000 mL | Freq: Once | INTRAVENOUS | Status: AC
  Administered 2017-07-31: 500 mL via INTRAVENOUS

## 2017-07-31 MED ORDER — FENTANYL 2.5 MCG/ML BUPIVACAINE 1/10 % EPIDURAL INFUSION (WH - ANES): 14.0000 mL/h | INTRAMUSCULAR | Status: DC | PRN

## 2017-07-31 MED ORDER — WITCH HAZEL-GLYCERIN EX PADS: 1.0000 "application " | MEDICATED_PAD | CUTANEOUS | Status: DC | PRN

## 2017-07-31 MED ORDER — ONDANSETRON HCL 4 MG/2ML IJ SOLN: 4.0000 mg | Freq: Four times a day (QID) | INTRAMUSCULAR | Status: DC | PRN

## 2017-07-31 MED ORDER — PRENATAL 27-0.8 MG PO TABS: 1.0000 | ORAL_TABLET | Freq: Every day | ORAL | Status: DC

## 2017-07-31 MED ORDER — BENZOCAINE-MENTHOL 20-0.5 % EX AERO: 1.0000 "application " | INHALATION_SPRAY | CUTANEOUS | Status: DC | PRN

## 2017-07-31 MED ORDER — IBUPROFEN 600 MG PO TABS
600.0000 mg | ORAL_TABLET | Freq: Four times a day (QID) | ORAL | Status: DC
  Administered 2017-07-31 – 2017-08-02 (×9): 600 mg via ORAL
  Filled 2017-07-31 (×10): qty 1

## 2017-07-31 MED ORDER — ACETAMINOPHEN 325 MG PO TABS: 650.0000 mg | ORAL_TABLET | ORAL | Status: DC | PRN

## 2017-07-31 MED ORDER — LIDOCAINE HCL (PF) 1 % IJ SOLN
30.0000 mL | INTRAMUSCULAR | Status: DC | PRN
  Filled 2017-07-31: qty 30

## 2017-07-31 MED ORDER — LACTATED RINGERS IV SOLN
500.0000 mL | Freq: Once | INTRAVENOUS | Status: AC
  Administered 2017-07-31: 500 mL via INTRAVENOUS

## 2017-07-31 MED ORDER — ZOLPIDEM TARTRATE 5 MG PO TABS: 5.0000 mg | ORAL_TABLET | Freq: Every evening | ORAL | Status: DC | PRN

## 2017-07-31 NOTE — H&P (Signed)
Mandy Vasquez is an 26 y.o. [phone] 6074w3d female.   Chief Complaint: labor HPI: labor began at 349. Very uncomfortable. Found to be 8-9 and breech in triage. P2. Limited PNC.  Past Medical History:  Diagnosis Date  . Anxiety     No past surgical history on file.  Family History  Problem Relation Age of Onset  . Diabetes Maternal Aunt   . Heart disease Maternal Aunt   . Diabetes Maternal Grandmother   . Heart disease Maternal Grandmother    Social History:  reports that  has never smoked. she has never used smokeless tobacco. She reports that she does not drink alcohol or use drugs.    Allergies  Allergen Reactions  . Latex   . Onion     Medications Prior to Admission  Medication Sig Dispense Refill  . Prenatal Vit-Fe Fumarate-FA (MULTIVITAMIN-PRENATAL) 27-0.8 MG TABS tablet Take 1 tablet by mouth daily at 12 noon.    . Doxylamine-Pyridoxine (DICLEGIS) 10-10 MG TBEC Take 1 tablet by mouth 3 (three) times daily. 1 tab in AM, 1 tab mid afternoon 2 tabs at bedtime. Max dose 4 tabs daily. 100 tablet prn  . hydrOXYzine (ATARAX/VISTARIL) 25 MG tablet Take 1 tablet (25 mg total) by mouth every 6 (six) hours as needed for itching. 30 tablet 3  . metroNIDAZOLE (FLAGYL) 500 MG tablet Take 4 tablets by mouth once. 4 tablet 0  . omeprazole (PRILOSEC) 20 MG capsule Take 1 capsule (20 mg total) by mouth 2 (two) times daily before a meal. 60 capsule 5  . Prenat-FeAsp-Meth-FA-DHA w/o A (PRENATE PIXIE) 10-0.6-0.4-200 MG CAPS Take 1 tablet by mouth daily. 30 capsule 12  . terconazole (TERAZOL 7) 0.4 % vaginal cream Place 1 applicator vaginally at bedtime. 45 g 0     A comprehensive review of systems was negative.  Blood pressure 99/66, pulse 75, temperature (!) 97 F (36.1 C), temperature source Oral, resp. rate 19, height 5\' 11"  (1.803 m), weight 135 lb (61.2 kg), last menstrual period 10/19/2016. General appearance: alert, cooperative and appears stated age Head: Normocephalic, without  obvious abnormality, atraumatic Neck: supple, symmetrical, trachea midline Lungs: normal effort Heart: regular rate and rhythm Abdomen: gravid, Non-tender Extremities: Homans sign is negative, no sign of DVT Skin: Skin color, texture, turgor normal. No rashes or lesions Neurologic: Grossly normal Dilation: 8 Presentation: (breech ) Exam by:: Mandy Vasquez  Bedside u/s confirms breech presentation  Lab Results  Component Value Date   WBC 8.5 04/12/2017   HGB 11.1 04/12/2017   HCT 35.8 04/12/2017   MCV 90 04/12/2017   PLT 203 04/12/2017         ABO, Rh: B/Positive/-- (07/16 1119)  Antibody: Negative (07/16 1119)  Rubella: 1.64 (07/16 1119)  RPR: Non Reactive (07/26 1513)  HBsAg: Negative (07/16 1119)  HIV:   Non-reactive GBS:   unknown     Assessment/Plan Active Problems:   Active preterm labor, fetus 1   Breech presentation of fetus palpable vaginally, fetus 1  Discussion of risks and benefits had. Opts for attempt at vaginal breech delivery. GBS unknown but term and no other risk factors.  Mandy Vasquez 07/31/2017, 12:45 AM

## 2017-07-31 NOTE — MAU Note (Signed)
Pt reports to MAU c/o ctx and SROM at 2100. Pt denies any problem in pregnancy. Good FM. No bleeding

## 2017-07-31 NOTE — Lactation Note (Signed)
This note was copied from a baby's chart. Lactation Consultation Note  Patient Name: Mandy Vasquez Today's Date: 07/31/2017  Attempted visit at 17 hours of age.  Baby is 1833w3d at 5# 8.2oz.  Baby has had 5 breast feedings with 3 bottle feedings in the past 24 hours.  Room is dark with baby quiet awake in crib and parents appear to be resting, but welcomed LC into room. LC offered to return later and left brochure for review.  Lc removed swaddle blanket from baby's face and encouraged mom to make sure baby's face is not covered with blanket.         Maternal Data    Feeding Feeding Type: Breast Fed Nipple Type: Slow - flow  LATCH Score                   Interventions    Lactation Tools Discussed/Used     Consult Status      Mandy Vasquez 07/31/2017, 7:15 PM

## 2017-08-01 NOTE — Plan of Care (Addendum)
  Pain Managment: General experience of comfort will improve 08/01/2017 0110 - Progressing by Ladarrell Cornwall, Selena BattenErin M, RN  Pt rating her pain 10 out of 10 (head). Pt is laughing with her children in the room. Pt states she thinks it is from exhaustion and not eating. RN asked pt if she would like RN to call Dr. and try to get a different medication to help with pain and pt stated that she would like to eat and rest first.  RN gave pt sandwich box to eat. Pt states she will eat and try to go to sleep.  Will continue to monitor.

## 2017-08-01 NOTE — Progress Notes (Signed)
POSTPARTUM PROGRESS NOTE  Post Partum Day 1  Subjective:  Mandy Vasquez is a 26 y.o. [id number] s/p breech SVD at 4634w4d.  No acute events overnight.  Pt denies problems with ambulating, voiding or po intake.  She denies nausea or vomiting.  Pain is well controlled.  She has had flatus. She has had bowel movement.  Lochia Minimal. Patient reports minor headache bright spots in her vision  Objective: Blood pressure 117/79, pulse 88, temperature 98.1 F (36.7 C), temperature source Oral, resp. rate 18, height 5\' 10"  (1.778 m), weight 61.2 kg (135 lb), last menstrual period 10/19/2016, SpO2 100 %.  Physical Exam:  General: alert, cooperative and no distress Chest: no respiratory distress Heart:regular rate, distal pulses intact Abdomen: soft, nontender,  Uterine Fundus: firm, appropriately tender DVT Evaluation: No calf swelling or tenderness Extremities: no edema   Recent Labs    07/31/17 0035  HGB 11.2*  HCT 33.7*    Assessment/Plan: Mandy Vasquez is a 26 y.o. [id number] s/p breech SVD at 2234w4d.   PPD#1 - Doing well. Continue to monitor headache and visual changes.   Contraception: Undecided Feeding: Both Dispo: Plan for discharge tomorrow.   LOS: 1 day   Sanjuana LettersMohamed Shayan Bramhall  Medical Student 08/01/2017, 6:12 AM
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
## 2017-08-02 MED ORDER — SENNOSIDES-DOCUSATE SODIUM 8.6-50 MG PO TABS: 2.0000 | ORAL_TABLET | ORAL | 0 refills | Status: DC

## 2017-08-02 MED ORDER — IBUPROFEN 600 MG PO TABS: 600.0000 mg | ORAL_TABLET | Freq: Four times a day (QID) | ORAL | 1 refills | Status: DC | PRN

## 2017-08-02 NOTE — Lactation Note (Signed)
This note was copied from a baby's chart. Lactation Consultation Note  Patient Name: Girl Chynna Sanfilippo Today's Date: 08/02/2017 Reason for consult: Follow-up assessment   P3, Mother concerned about her milk supply so she has been mostly formula feeding. Mother has slight abrasions on tips.  She has coconut oil and recommend ebm.   Reviewed hand expression and mother easily expressed drops of breastmilk. Assisted w/ latching baby in cross cradle hold.  Encouraged mother to compress breast during feeding. Intermittent sucks and swallows observed.  Recommend bf on both breasts per session. Mom encouraged to feed baby 8-12 times/24 hours and with feeding cues at least q 3 hours.  Suggest breastfeeding before offering formula to help establish her milk supply. Reviewed engorgement care and monitoring voids/stools.     Maternal Data    Feeding    LATCH Score                   Interventions    Lactation Tools Discussed/Used     Consult Status Consult Status: Complete    Hardie PulleyBerkelhammer, Ruth Boschen 08/02/2017, 9:32 AM

## 2017-08-02 NOTE — Discharge Instructions (Signed)
Postpartum Care After Vaginal Delivery °The period of time right after you deliver your newborn is called the postpartum period. °What kind of medical care will I receive? °· You may continue to receive fluids and medicines through an IV tube inserted into one of your veins. °· If an incision was made near your vagina (episiotomy) or if you had some vaginal tearing during delivery, cold compresses may be placed on your episiotomy or your tear. This helps to reduce pain and swelling. °· You may be given a squirt bottle to use when you go to the bathroom. You may use this until you are comfortable wiping as usual. To use the squirt bottle, follow these steps: °? Before you urinate, fill the squirt bottle with warm water. Do not use hot water. °? After you urinate, while you are sitting on the toilet, use the squirt bottle to rinse the area around your urethra and vaginal opening. This rinses away any urine and blood. °? You may do this instead of wiping. As you start healing, you may use the squirt bottle before wiping yourself. Make sure to wipe gently. °? Fill the squirt bottle with clean water every time you use the bathroom. °· You will be given sanitary pads to wear. °How can I expect to feel? °· You may not feel the need to urinate for several hours after delivery. °· You will have some soreness and pain in your abdomen and vagina. °· If you are breastfeeding, you may have uterine contractions every time you breastfeed for up to several weeks postpartum. Uterine contractions help your uterus return to its normal size. °· It is normal to have vaginal bleeding (lochia) after delivery. The amount and appearance of lochia is often similar to a menstrual period in the first week after delivery. It will gradually decrease over the next few weeks to a dry, yellow-brown discharge. For most women, lochia stops completely by 6-8 weeks after delivery. Vaginal bleeding can vary from woman to woman. °· Within the first few  days after delivery, you may have breast engorgement. This is when your breasts feel heavy, full, and uncomfortable. Your breasts may also throb and feel hard, tightly stretched, warm, and tender. After this occurs, you may have milk leaking from your breasts. Your health care provider can help you relieve discomfort due to breast engorgement. Breast engorgement should go away within a few days. °· You may feel more sad or worried than normal due to hormonal changes after delivery. These feelings should not last more than a few days. If these feelings do not go away after several days, speak with your health care provider. °How should I care for myself? °· Tell your health care provider if you have pain or discomfort. °· Drink enough water to keep your urine clear or pale yellow. °· Wash your hands thoroughly with soap and water for at least 20 seconds after changing your sanitary pads, after using the toilet, and before holding or feeding your baby. °· If you are not breastfeeding, avoid touching your breasts a lot. Doing this can make your breasts produce more milk. °· If you become weak or lightheaded, or you feel like you might faint, ask for help before: °? Getting out of bed. °? Showering. °· Change your sanitary pads frequently. Watch for any changes in your flow, such as a sudden increase in volume, a change in color, the passing of large blood clots. If you pass a blood clot from your vagina, save it   to show to your health care provider. Do not flush blood clots down the toilet without having your health care provider look at them. °· Make sure that all your vaccinations are up to date. This can help protect you and your baby from getting certain diseases. You may need to have immunizations done before you leave the hospital. °· If desired, talk with your health care provider about methods of family planning or birth control (contraception). °How can I start bonding with my baby? °Spending as much time as  possible with your baby is very important. During this time, you and your baby can get to know each other and develop a bond. Having your baby stay with you in your room (rooming in) can give you time to get to know your baby. Rooming in can also help you become comfortable caring for your baby. Breastfeeding can also help you bond with your baby. °How can I plan for returning home with my baby? °· Make sure that you have a car seat installed in your vehicle. °? Your car seat should be checked by a certified car seat installer to make sure that it is installed safely. °? Make sure that your baby fits into the car seat safely. °· Ask your health care provider any questions you have about caring for yourself or your baby. Make sure that you are able to contact your health care provider with any questions after leaving the hospital. °This information is not intended to replace advice given to you by your health care provider. Make sure you discuss any questions you have with your health care provider. °Document Released: 07/02/2007 Document Revised: 02/07/2016 Document Reviewed: 08/09/2015 °Elsevier Interactive Patient Education © 2018 Elsevier Inc. ° °

## 2017-08-02 NOTE — Discharge Summary (Signed)
OB Discharge Summary     Patient Name: Mandy Vasquez Rundquist DOB: [birth year] MRN: [id number]  Date of admission: 07/31/2017 Delivering MD: Reva BoresPRATT, TANYA S   Date of discharge: 08/02/2017  Admitting diagnosis: 38 WEEKS CTX ROM Intrauterine pregnancy: 8020w5d     Secondary diagnosis:  Active Problems:   Active preterm labor, fetus 1   Breech presentation of fetus palpable vaginally, fetus 1   Indication for care in labor and delivery, antepartum  Additional problems:  Patient Active Problem List   Diagnosis Date Noted  . Active preterm labor, fetus 1 07/31/2017  . Breech presentation of fetus palpable vaginally, fetus 1 07/31/2017  . Indication for care in labor and delivery, antepartum 07/31/2017  . Trichomonal vaginitis in pregnancy 04/12/2017  . Supervision of high risk pregnancy, antepartum 04/02/2017  . History of preterm delivery, currently pregnant 04/02/2017  . Late prenatal care affecting pregnancy 04/02/2017    Discharge diagnosis: Term Pregnancy Delivered Breech                                                                                               Post partum procedures:None  Augmentation: None  Complications: None  Hospital course:  Onset of Labor With Vaginal Delivery     26 y.o. yo [id number] at 8341w6d was admitted in Active Labor on 07/31/2017. Patient had an uncomplicated labor course as follows:  Membrane Rupture Time/Date: 9:00 PM ,07/30/2017   Intrapartum Procedures: Episiotomy: None [1]                                         Lacerations:  None [1]  Patient had a delivery of a Viable infant. 07/31/2017  Information for the patient's newborn:  Washer, Girl Mandy Vasquez [[id number]]  Delivery Method: Vaginal, Spontaneous(Filed from Delivery Summary)    Pateint had an uncomplicated postpartum course.  She is ambulating, tolerating a regular diet, passing flatus, and urinating well. Patient is discharged home in stable condition on 08/03/17.   Physical exam   Vitals:   08/01/17 0548 08/01/17 1742 08/01/17 2023 08/02/17 0532  BP: 117/79 110/68 104/64 108/62  Pulse: 88 (!) 58 64 (!) 52  Resp: 18 17  18   Temp: 98.1 F (36.7 C) 99.1 F (37.3 C)  98.6 F (37 C)  TempSrc: Oral Oral  Oral  SpO2:    100%  Weight:      Height:       General: alert, cooperative and no distress Lochia: appropriate Uterine Fundus: firm Incision: N/A DVT Evaluation: No evidence of DVT seen on physical exam. Labs: Lab Results  Component Value Date   WBC 9.9 07/31/2017   HGB 11.2 (L) 07/31/2017   HCT 33.7 (L) 07/31/2017   MCV 86.2 07/31/2017   PLT 201 07/31/2017   CMP Latest Ref Rng & Units 04/25/2017  Glucose 65 - 99 mg/dL 68  BUN 6 - 20 mg/dL -  Creatinine 1.300.44 - 8.651.00 mg/dL -  Sodium 784135 - 696145 mmol/L -  Potassium 3.5 - 5.1 mmol/L -  Chloride 101 -  111 mmol/L -  CO2 22 - 32 mmol/L -  Calcium 8.9 - 10.3 mg/dL -  Total Protein 6.5 - 8.1 g/dL -  Total Bilirubin 0.3 - 1.2 mg/dL -  Alkaline Phos 38 - 161126 U/L -  AST 15 - 41 U/L -  ALT 14 - 54 U/L -    Discharge instruction: per After Visit Summary and "Baby and Me Booklet".  After visit meds:  Allergies as of 08/02/2017      Reactions   Latex Swelling   Onion Swelling      Medication List    TAKE these medications   ibuprofen 600 MG tablet Commonly known as:  ADVIL,MOTRIN Take 1 tablet (600 mg total) every 6 (six) hours as needed by mouth.   multivitamin-prenatal 27-0.8 MG Tabs tablet Take 1 tablet by mouth daily at 12 noon.   senna-docusate 8.6-50 MG tablet Commonly known as:  Senokot-S Take 2 tablets daily by mouth.       Diet: routine diet  Activity: Advance as tolerated. Pelvic rest for 6 weeks.   Outpatient follow up:4 weeks Follow up Appt: Future Appointments  Date Time Provider Department Center  08/28/2017  9:45 AM Roe Coombsenney, Rachelle A, CNM [facility] None   Follow up Visit:No Follow-up on file.  Postpartum contraception: Undecided  Newborn Data: Live born female  Birth  Weight: 5 lb 8.2 oz (2500 g) APGAR: 9, 9  Newborn Delivery   Birth date/time:  07/31/2017 01:26:00 Delivery type:  Vaginal, Spontaneous     Baby Feeding: Bottle Disposition:home with mother   08/02/2017 Caryl AdaJazma Phelps, DO

## 2017-08-06 ENCOUNTER — Ambulatory Visit: Payer: Medicaid Other | Admitting: Obstetrics and Gynecology

## 2017-08-19 ENCOUNTER — Emergency Department (HOSPITAL_COMMUNITY)
Admission: EM | Admit: 2017-08-19 | Discharge: 2017-08-19 | Disposition: A | Discharge status: Home / Self Care | Payer: Medicaid Other | Attending: Emergency Medicine | Admitting: Emergency Medicine

## 2017-08-19 ENCOUNTER — Encounter (HOSPITAL_COMMUNITY): Payer: Self-pay

## 2017-08-19 DIAGNOSIS — Y998 Other external cause status: Secondary | ICD-10-CM | POA: Diagnosis not present

## 2017-08-19 DIAGNOSIS — X118XXA Contact with other hot tap-water, initial encounter: Secondary | ICD-10-CM | POA: Diagnosis not present

## 2017-08-19 DIAGNOSIS — Y92000 Kitchen of unspecified non-institutional (private) residence as  the place of occurrence of the external cause: Secondary | ICD-10-CM | POA: Insufficient documentation

## 2017-08-19 DIAGNOSIS — Y93G1 Activity, food preparation and clean up: Secondary | ICD-10-CM | POA: Diagnosis not present

## 2017-08-19 DIAGNOSIS — Z23 Encounter for immunization: Secondary | ICD-10-CM | POA: Insufficient documentation

## 2017-08-19 DIAGNOSIS — Z79899 Other long term (current) drug therapy: Secondary | ICD-10-CM | POA: Insufficient documentation

## 2017-08-19 DIAGNOSIS — T24211A Burn of second degree of right thigh, initial encounter: Secondary | ICD-10-CM | POA: Diagnosis not present

## 2017-08-19 DIAGNOSIS — S79921A Unspecified injury of right thigh, initial encounter: Secondary | ICD-10-CM | POA: Diagnosis present

## 2017-08-19 MED ORDER — IBUPROFEN 600 MG PO TABS: 600.0000 mg | ORAL_TABLET | Freq: Four times a day (QID) | ORAL | 0 refills | Status: DC | PRN

## 2017-08-19 MED ORDER — TRAMADOL HCL 50 MG PO TABS: 50.0000 mg | ORAL_TABLET | Freq: Four times a day (QID) | ORAL | 0 refills | Status: DC | PRN

## 2017-08-19 MED ORDER — SILVER SULFADIAZINE 1 % EX CREA
TOPICAL_CREAM | Freq: Once | CUTANEOUS | Status: AC
  Administered 2017-08-19: 19:00:00 via TOPICAL
  Filled 2017-08-19: qty 85

## 2017-08-19 MED ORDER — TETANUS-DIPHTH-ACELL PERTUSSIS 5-2.5-18.5 LF-MCG/0.5 IM SUSP
0.5000 mL | Freq: Once | INTRAMUSCULAR | Status: AC
  Administered 2017-08-19: 0.5 mL via INTRAMUSCULAR
  Filled 2017-08-19: qty 0.5

## 2017-08-19 MED ORDER — OXYCODONE-ACETAMINOPHEN 5-325 MG PO TABS
1.0000 | ORAL_TABLET | Freq: Once | ORAL | Status: AC
  Administered 2017-08-19: 1 via ORAL
  Filled 2017-08-19: qty 1
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
## 2017-08-28 ENCOUNTER — Ambulatory Visit: Payer: Medicaid Other | Admitting: Certified Nurse Midwife

## 2017-11-03 ENCOUNTER — Encounter (HOSPITAL_COMMUNITY): Payer: Self-pay

## 2017-12-11 ENCOUNTER — Emergency Department (HOSPITAL_COMMUNITY)
Admission: EM | Admit: 2017-12-11 | Discharge: 2017-12-11 | Disposition: A | Discharge status: Home / Self Care | Payer: Self-pay | Attending: Emergency Medicine | Admitting: Emergency Medicine

## 2017-12-11 ENCOUNTER — Other Ambulatory Visit: Payer: Self-pay

## 2017-12-11 ENCOUNTER — Encounter (HOSPITAL_COMMUNITY): Payer: Self-pay

## 2017-12-11 ENCOUNTER — Emergency Department (HOSPITAL_COMMUNITY): Payer: Self-pay

## 2017-12-11 DIAGNOSIS — Z91040 Latex allergy status: Secondary | ICD-10-CM | POA: Insufficient documentation

## 2017-12-11 DIAGNOSIS — R55 Syncope and collapse: Secondary | ICD-10-CM

## 2017-12-11 DIAGNOSIS — Z79899 Other long term (current) drug therapy: Secondary | ICD-10-CM | POA: Insufficient documentation

## 2017-12-11 DIAGNOSIS — E860 Dehydration: Secondary | ICD-10-CM

## 2017-12-11 LAB — COMPREHENSIVE METABOLIC PANEL
ALBUMIN: 3.8 g/dL (ref 3.5–5.0)
ALT: 16 U/L (ref 14–54)
AST: 19 U/L (ref 15–41)
Alkaline Phosphatase: 65 U/L (ref 38–126)
Anion gap: 6 (ref 5–15)
BILIRUBIN TOTAL: 0.8 mg/dL (ref 0.3–1.2)
BUN: 15 mg/dL (ref 6–20)
CO2: 20 mmol/L — ABNORMAL LOW (ref 22–32)
Calcium: 8.7 mg/dL — ABNORMAL LOW (ref 8.9–10.3)
Chloride: 112 mmol/L — ABNORMAL HIGH (ref 101–111)
Creatinine, Ser: 0.69 mg/dL (ref 0.44–1.00)
GFR calc Af Amer: >60 mL/min (ref >60)
GFR calc non Af Amer: >60 mL/min (ref >60)
GLUCOSE: 89 mg/dL (ref 65–99)
POTASSIUM: 3.4 mmol/L — AB (ref 3.5–5.1)
Sodium: 138 mmol/L (ref 135–145)
TOTAL PROTEIN: 6.6 g/dL (ref 6.5–8.1)

## 2017-12-11 LAB — CBC WITH DIFFERENTIAL/PLATELET
BASOS PCT: 0 %
Basophils Absolute: 0 10*3/uL (ref 0.0–0.1)
EOS ABS: 0 10*3/uL (ref 0.0–0.7)
Eosinophils Relative: 1 %
HEMATOCRIT: 33.4 % — AB (ref 36.0–46.0)
HEMOGLOBIN: 10.8 g/dL — AB (ref 12.0–15.0)
Lymphocytes Relative: 34 %
Lymphs Abs: 1.4 10*3/uL (ref 0.7–4.0)
MCH: 26.7 pg (ref 26.0–34.0)
MCHC: 32.3 g/dL (ref 30.0–36.0)
MCV: 82.5 fL (ref 78.0–100.0)
MONOS PCT: 1 %
Monocytes Absolute: 0 10*3/uL — ABNORMAL LOW (ref 0.1–1.0)
NEUTROS ABS: 2.6 10*3/uL (ref 1.7–7.7)
Neutrophils Relative %: 64 %
Platelets: 157 10*3/uL (ref 150–400)
RBC: 4.05 MIL/uL (ref 3.87–5.11)
RDW: 14.9 % (ref 11.5–15.5)
WBC: 4.1 10*3/uL (ref 4.0–10.5)

## 2017-12-11 LAB — I-STAT BETA HCG BLOOD, ED (MC, WL, AP ONLY)

## 2017-12-11 LAB — LIPASE, BLOOD: LIPASE: 22 U/L (ref 11–51)

## 2017-12-11 LAB — SAMPLE TO BLOOD BANK

## 2017-12-11 MED ORDER — SODIUM CHLORIDE 0.9 % IV SOLN
INTRAVENOUS | Status: DC
  Administered 2017-12-11: 12:00:00 via INTRAVENOUS

## 2017-12-11 MED ORDER — SODIUM CHLORIDE 0.9 % IV BOLUS
1000.0000 mL | Freq: Once | INTRAVENOUS | Status: AC
  Administered 2017-12-11: 1000 mL via INTRAVENOUS

## 2017-12-11 MED ORDER — ONDANSETRON HCL 4 MG/2ML IJ SOLN
4.0000 mg | Freq: Once | INTRAMUSCULAR | Status: AC
  Administered 2017-12-11: 4 mg via INTRAVENOUS
  Filled 2017-12-11: qty 2

## 2017-12-11 NOTE — ED Provider Notes (Signed)
[facility] EMERGENCY DEPARTMENT Provider Note   CSN: [id number] Arrival date & time: 12/11/17  1051     History   Chief Complaint Chief Complaint  Patient presents with  . Loss of Consciousness    HPI Mandy Vasquez is a 27 y.o. female.  HPI Patient presented to the emergency room for evaluation of nausea, diarrhea, abdominal pain and a syncopal episode.  Patient states yesterday she started having diarrhea.  She had a couple of episodes.  This morning when she woke up she was nauseated but did not vomit.  She had more diarrhea and noticed some blood in her stool.  When she went to stand up she became lightheaded and had a syncopal episode.  Patient fell hitting her neck against something in the bathroom.  Patient states she now has discomfort in her neck and throat.  She was not having any sore throat issues prior to the fall.  She denies any back pain.  No headache.  No focal numbness but does have generalized weakness Past Medical History:  Diagnosis Date  . Anxiety     Patient Active Problem List   Diagnosis Date Noted  . Active preterm labor, fetus 1 07/31/2017  . Breech presentation of fetus palpable vaginally, fetus 1 07/31/2017  . Indication for care in labor and delivery, antepartum 07/31/2017  . Trichomonal vaginitis in pregnancy 04/12/2017  . Supervision of high risk pregnancy, antepartum 04/02/2017  . History of preterm delivery, currently pregnant 04/02/2017  . Late prenatal care affecting pregnancy 04/02/2017    History reviewed. No pertinent surgical history.   OB History    Gravida  5   Para  2   Term  1   Preterm  1   AB  1   Living  2     SAB  1   TAB      Ectopic      Multiple      Live Births  2            Home Medications    Prior to Admission medications   Medication Sig Start Date End Date Taking? Authorizing Provider  ibuprofen (ADVIL,MOTRIN) 600 MG tablet Take 1 tablet (600 mg total) by mouth every 6  (six) hours as needed. 08/19/17   Janne Napoleon, NP  Prenatal Vit-Fe Fumarate-FA (MULTIVITAMIN-PRENATAL) 27-0.8 MG TABS tablet Take 1 tablet by mouth daily at 12 noon.    [provider]  senna-docusate (SENOKOT-S) 8.6-50 MG tablet Take 2 tablets daily by mouth. 08/03/17   Lockamy, Timothy, DO  traMADol (ULTRAM) 50 MG tablet Take 1 tablet (50 mg total) by mouth every 6 (six) hours as needed. 08/19/17   Janne Napoleon, NP    Family History Family History  Problem Relation Age of Onset  . Diabetes Maternal Aunt   . Heart disease Maternal Aunt   . Diabetes Maternal Grandmother   . Heart disease Maternal Grandmother     Social History Social History   Tobacco Use  . Smoking status: Never Smoker  . Smokeless tobacco: Never Used  Substance Use Topics  . Alcohol use: No  . Drug use: No     Allergies   Latex and Onion   Review of Systems Review of Systems  All other systems reviewed and are negative.    Physical Exam Updated Vital Signs BP 123/90 (BP Location: Right Arm)   Pulse (!) 50   Temp 98.9 F (37.2 C) (Oral)   Resp 16  SpO2 100%   Physical Exam  Constitutional: She appears well-developed and well-nourished. No distress.  HENT:  Head: Normocephalic and atraumatic.  Right Ear: External ear normal.  Left Ear: External ear normal.  Mouth/Throat: No oropharyngeal exudate.  Eyes: Conjunctivae are normal. Right eye exhibits no discharge. Left eye exhibits no discharge. No scleral icterus.  Neck: Neck supple. No tracheal deviation present.  Cardiovascular: Normal rate, regular rhythm and intact distal pulses.  Pulmonary/Chest: Effort normal and breath sounds normal. No stridor. No respiratory distress. She has no wheezes. She has no rales.  Abdominal: Soft. Bowel sounds are normal. She exhibits no distension. There is no tenderness. There is no rebound and no guarding.  Musculoskeletal: She exhibits no edema.       Cervical back: She exhibits tenderness. She  exhibits no swelling.       Thoracic back: Normal.       Lumbar back: Normal.  Neurological: She is alert. She has normal strength. No cranial nerve deficit (no facial droop, extraocular movements intact, no slurred speech) or sensory deficit. She exhibits normal muscle tone. She displays no seizure activity. Coordination normal.  Skin: Skin is warm and dry. No rash noted.  Psychiatric: She has a normal mood and affect.  Nursing note and vitals reviewed.    ED Treatments / Results  Labs (all labs ordered are listed, but only abnormal results are displayed) Labs Reviewed  COMPREHENSIVE METABOLIC PANEL - Abnormal; Notable for the following components:      Result Value   Potassium 3.4 (*)    Chloride 112 (*)    CO2 20 (*)    Calcium 8.7 (*)    All other components within normal limits  CBC WITH DIFFERENTIAL/PLATELET - Abnormal; Notable for the following components:   Hemoglobin 10.8 (*)    HCT 33.4 (*)    Monocytes Absolute 0.0 (*)    All other components within normal limits  LIPASE, BLOOD  URINALYSIS, ROUTINE W REFLEX MICROSCOPIC  I-STAT BETA HCG BLOOD, ED ([facility], [facility], [facility] ONLY)  SAMPLE TO BLOOD BANK    EKG EKG Interpretation  Date/Time:  Tuesday December 11 2017 11:33:52 EDT Ventricular Rate:  52 PR Interval:  166 QRS Duration: 72 QT Interval:  462 QTC Calculation: 429 R Axis:   86 Text Interpretation:  Sinus bradycardia with marked sinus arrhythmia with ventricular escape complexes Otherwise normal ECG Since last tracing rate slower Confirmed by Linwood DibblesKnapp, Chuckie Mccathern [phone]([id number]) on 12/11/2017 11:36:12 AM   Radiology Ct Head Wo Contrast  Result Date: 12/11/2017 CLINICAL DATA:  Pain following fall EXAM: CT HEAD WITHOUT CONTRAST CT CERVICAL SPINE WITHOUT CONTRAST TECHNIQUE: Multidetector CT imaging of the head and cervical spine was performed following the standard protocol without intravenous contrast. Multiplanar CT image reconstructions of the cervical spine were also generated. COMPARISON:   None. FINDINGS: CT HEAD FINDINGS Brain: The ventricles are normal in size and configuration. There is no mass, hemorrhage, extra-axial fluid collection, or midline shift. Gray-white compartments appear normal. No evident acute infarct. Vascular: No hyperdense vessel. No vascular calcifications are evident. Skull: The bony calvarium appears intact. Sinuses/Orbits: There is opacification in a mid right ethmoid air cell. Other visualized paranasal sinuses are clear. Orbits appear symmetric bilaterally. Other: Mastoid air cells are clear. CT CERVICAL SPINE FINDINGS Alignment: There is no demonstrable spondylolisthesis. Skull base and vertebrae: Skull base and craniocervical junction regions appear normal. No fracture. No blastic or lytic bone lesions. Soft tissues and spinal canal: Prevertebral soft tissues and predental space regions are  normal. No paraspinous lesions are evident. There is no evident cord or canal hematoma. Disc levels: Disc spaces appear normal. No nerve root edema or effacement. No disc extrusion or stenosis. Upper chest: Visualized upper chest region appears normal. Other: None IMPRESSION: CT head: Opacification of a mid right ethmoid air cell. Study otherwise unremarkable. CT cervical spine: No fracture or spondylolisthesis. No evident arthropathy. Electronically Signed   By: Bretta Bang III M.D.   On: 12/11/2017 14:23   Ct Cervical Spine Wo Contrast  Result Date: 12/11/2017 CLINICAL DATA:  Pain following fall EXAM: CT HEAD WITHOUT CONTRAST CT CERVICAL SPINE WITHOUT CONTRAST TECHNIQUE: Multidetector CT imaging of the head and cervical spine was performed following the standard protocol without intravenous contrast. Multiplanar CT image reconstructions of the cervical spine were also generated. COMPARISON:  None. FINDINGS: CT HEAD FINDINGS Brain: The ventricles are normal in size and configuration. There is no mass, hemorrhage, extra-axial fluid collection, or midline shift. Gray-white  compartments appear normal. No evident acute infarct. Vascular: No hyperdense vessel. No vascular calcifications are evident. Skull: The bony calvarium appears intact. Sinuses/Orbits: There is opacification in a mid right ethmoid air cell. Other visualized paranasal sinuses are clear. Orbits appear symmetric bilaterally. Other: Mastoid air cells are clear. CT CERVICAL SPINE FINDINGS Alignment: There is no demonstrable spondylolisthesis. Skull base and vertebrae: Skull base and craniocervical junction regions appear normal. No fracture. No blastic or lytic bone lesions. Soft tissues and spinal canal: Prevertebral soft tissues and predental space regions are normal. No paraspinous lesions are evident. There is no evident cord or canal hematoma. Disc levels: Disc spaces appear normal. No nerve root edema or effacement. No disc extrusion or stenosis. Upper chest: Visualized upper chest region appears normal. Other: None IMPRESSION: CT head: Opacification of a mid right ethmoid air cell. Study otherwise unremarkable. CT cervical spine: No fracture or spondylolisthesis. No evident arthropathy. Electronically Signed   By: Bretta Bang III M.D.   On: 12/11/2017 14:23    Procedures Procedures (including critical care time)  Medications Ordered in ED Medications  sodium chloride 0.9 % bolus 1,000 mL (1,000 mLs Intravenous Given 12/11/17 1131)    And  0.9 %  sodium chloride infusion ( Intravenous New Bag/Given 12/11/17 1132)  ondansetron (ZOFRAN) injection 4 mg (4 mg Intravenous Given 12/11/17 1131)     Initial Impression / Assessment and Plan / ED Course  I have reviewed the triage vital signs and the nursing notes.  Pertinent labs & imaging results that were available during my care of the patient were reviewed by me and considered in my medical decision making (see chart for details).   Patient presented to the emergency room for evaluation after a syncopal episode.  I suspect the patient had a  vasovagal episode.  Her laboratory tests are reassuring.  No signs of severe dehydration.  She has a mild anemia but this appears stable.  CT scans were performed because of her head and neck pain after a syncopal episode.  No acute findings were noted.  Patient is feeling better after IV fluids.  She appears stable for discharge this point.  Final Clinical Impressions(s) / ED Diagnoses   Final diagnoses:  Syncope, unspecified syncope type  Dehydration    ED Discharge Orders    None       Linwood Dibbles, MD 12/11/17 1500

## 2017-12-11 NOTE — ED Notes (Signed)
Patient transported to CT 

## 2017-12-11 NOTE — ED Notes (Signed)
Pt given water per request

## 2017-12-11 NOTE — Discharge Instructions (Addendum)
Make sure to drink plenty of fluids, follow-up with your primary doctor later this week if the symptoms are not improving

## 2017-12-11 NOTE — ED Notes (Signed)
Pt reported that to this RN by texting on her phone and showing me that she is unable to speak because she muscles is left side of neck were "clenching". Pt also typed out "some thing is really wrong. This keeps happening ever since I had my first daughter 4 years ago. I got really sick with a blood infection and ended up in ICU and my kidneys and liver were messed up. Now I keep coming in for this and they send me home after my vital go back to normal." Pt crying at this time. I reassured pt we would be checking multiple things with blood work and getting a CT of neck and head. PT appears calmer after this.

## 2017-12-11 NOTE — ED Triage Notes (Signed)
[facility]- pt coming from home with complaint of RLQ abd pain, diarrhea, fever of 103 with EMS. Pt also reports sore throat. Pt had a syncopal episode with neck pain after fall. C-collar in place. Had a baby 4 months ago.

## 2018-02-25 ENCOUNTER — Encounter (HOSPITAL_COMMUNITY): Payer: Self-pay | Admitting: *Deleted

## 2018-02-25 ENCOUNTER — Other Ambulatory Visit: Payer: Self-pay

## 2018-02-25 ENCOUNTER — Inpatient Hospital Stay (HOSPITAL_COMMUNITY): Payer: Self-pay

## 2018-02-25 ENCOUNTER — Inpatient Hospital Stay (HOSPITAL_COMMUNITY)
Admission: AD | Admit: 2018-02-25 | Discharge: 2018-02-28 | DRG: 872 | Disposition: A | Discharge status: Home / Self Care | Payer: Self-pay | Source: Other Acute Inpatient Hospital | Attending: Internal Medicine | Admitting: Internal Medicine

## 2018-02-25 DIAGNOSIS — R202 Paresthesia of skin: Secondary | ICD-10-CM | POA: Diagnosis present

## 2018-02-25 DIAGNOSIS — R531 Weakness: Secondary | ICD-10-CM | POA: Diagnosis present

## 2018-02-25 DIAGNOSIS — M436 Torticollis: Secondary | ICD-10-CM | POA: Diagnosis present

## 2018-02-25 DIAGNOSIS — M542 Cervicalgia: Secondary | ICD-10-CM | POA: Diagnosis present

## 2018-02-25 DIAGNOSIS — D649 Anemia, unspecified: Secondary | ICD-10-CM | POA: Diagnosis present

## 2018-02-25 DIAGNOSIS — E876 Hypokalemia: Secondary | ICD-10-CM | POA: Diagnosis present

## 2018-02-25 DIAGNOSIS — N10 Acute tubulo-interstitial nephritis: Secondary | ICD-10-CM | POA: Diagnosis present

## 2018-02-25 DIAGNOSIS — R51 Headache: Secondary | ICD-10-CM | POA: Diagnosis present

## 2018-02-25 DIAGNOSIS — Z91018 Allergy to other foods: Secondary | ICD-10-CM

## 2018-02-25 DIAGNOSIS — Z833 Family history of diabetes mellitus: Secondary | ICD-10-CM

## 2018-02-25 DIAGNOSIS — A419 Sepsis, unspecified organism: Principal | ICD-10-CM | POA: Diagnosis present

## 2018-02-25 DIAGNOSIS — M545 Low back pain: Secondary | ICD-10-CM | POA: Diagnosis present

## 2018-02-25 DIAGNOSIS — D72829 Elevated white blood cell count, unspecified: Secondary | ICD-10-CM

## 2018-02-25 DIAGNOSIS — Z597 Insufficient social insurance and welfare support: Secondary | ICD-10-CM

## 2018-02-25 DIAGNOSIS — Z9181 History of falling: Secondary | ICD-10-CM

## 2018-02-25 DIAGNOSIS — Z91040 Latex allergy status: Secondary | ICD-10-CM

## 2018-02-25 DIAGNOSIS — N12 Tubulo-interstitial nephritis, not specified as acute or chronic: Secondary | ICD-10-CM | POA: Diagnosis present

## 2018-02-25 DIAGNOSIS — Z8249 Family history of ischemic heart disease and other diseases of the circulatory system: Secondary | ICD-10-CM

## 2018-02-25 DIAGNOSIS — R292 Abnormal reflex: Secondary | ICD-10-CM | POA: Diagnosis present

## 2018-02-25 LAB — CBC WITH DIFFERENTIAL/PLATELET
BASOS ABS: 0 10*3/uL (ref 0.0–0.1)
Basophils Relative: 0 %
Eosinophils Absolute: 0 10*3/uL (ref 0.0–0.7)
Eosinophils Relative: 0 %
HEMATOCRIT: 34.6 % — AB (ref 36.0–46.0)
Hemoglobin: 11.7 g/dL — ABNORMAL LOW (ref 12.0–15.0)
LYMPHS PCT: 5 %
Lymphs Abs: 1.1 10*3/uL (ref 0.7–4.0)
MCH: 28.1 pg (ref 26.0–34.0)
MCHC: 33.8 g/dL (ref 30.0–36.0)
MCV: 83.2 fL (ref 78.0–100.0)
MONO ABS: 1.7 10*3/uL — AB (ref 0.1–1.0)
MONOS PCT: 7 %
NEUTROS ABS: 19.7 10*3/uL — AB (ref 1.7–7.7)
Neutrophils Relative %: 88 %
Platelets: 204 10*3/uL (ref 150–400)
RBC: 4.16 MIL/uL (ref 3.87–5.11)
RDW: 13.8 % (ref 11.5–15.5)
WBC: 22.4 10*3/uL — ABNORMAL HIGH (ref 4.0–10.5)

## 2018-02-25 LAB — COMPREHENSIVE METABOLIC PANEL
ALBUMIN: 3.2 g/dL — AB (ref 3.5–5.0)
ALK PHOS: 70 U/L (ref 38–126)
ALT: 10 U/L — ABNORMAL LOW (ref 14–54)
ANION GAP: 9 (ref 5–15)
AST: 13 U/L — ABNORMAL LOW (ref 15–41)
BUN: 8 mg/dL (ref 6–20)
CHLORIDE: 107 mmol/L (ref 101–111)
CO2: 21 mmol/L — AB (ref 22–32)
Calcium: 7.4 mg/dL — ABNORMAL LOW (ref 8.9–10.3)
Creatinine, Ser: 0.75 mg/dL (ref 0.44–1.00)
GFR calc Af Amer: >60 mL/min (ref >60)
GFR calc non Af Amer: >60 mL/min (ref >60)
GLUCOSE: 107 mg/dL — AB (ref 65–99)
Potassium: 2.9 mmol/L — ABNORMAL LOW (ref 3.5–5.1)
SODIUM: 137 mmol/L (ref 135–145)
Total Bilirubin: 0.8 mg/dL (ref 0.3–1.2)
Total Protein: 6.9 g/dL (ref 6.5–8.1)

## 2018-02-25 LAB — LACTIC ACID, PLASMA: Lactic Acid, Venous: 0.9 mmol/L (ref 0.5–1.9)

## 2018-02-25 LAB — URINALYSIS, ROUTINE W REFLEX MICROSCOPIC
Bilirubin Urine: NEGATIVE
GLUCOSE, UA: NEGATIVE mg/dL
Ketones, ur: NEGATIVE mg/dL
NITRITE: POSITIVE — AB
Protein, ur: 100 mg/dL — AB
SPECIFIC GRAVITY, URINE: 1.016 (ref 1.005–1.030)
WBC, UA: >50 WBC/hpf — ABNORMAL HIGH (ref 0–5)
pH: 5 (ref 5.0–8.0)

## 2018-02-25 LAB — POCT PREGNANCY, URINE: PREG TEST UR: NEGATIVE

## 2018-02-25 MED ORDER — ACETAMINOPHEN 500 MG PO TABS
1000.0000 mg | ORAL_TABLET | Freq: Once | ORAL | Status: AC
  Administered 2018-02-25: 1000 mg via ORAL
  Filled 2018-02-25: qty 2

## 2018-02-25 MED ORDER — SODIUM CHLORIDE 0.9 % IV BOLUS: 1000.0000 mL | Freq: Once | INTRAVENOUS | Status: DC

## 2018-02-25 MED ORDER — PROCHLORPERAZINE EDISYLATE 10 MG/2ML IJ SOLN
10.0000 mg | Freq: Once | INTRAMUSCULAR | Status: AC
  Administered 2018-02-25: 10 mg via INTRAVENOUS
  Filled 2018-02-25: qty 2

## 2018-02-25 MED ORDER — ENSURE ENLIVE PO LIQD: 237.0000 mL | Freq: Two times a day (BID) | ORAL | Status: DC

## 2018-02-25 MED ORDER — IOPAMIDOL (ISOVUE-300) INJECTION 61%
INTRAVENOUS | Status: AC
  Filled 2018-02-25: qty 100

## 2018-02-25 MED ORDER — SENNOSIDES-DOCUSATE SODIUM 8.6-50 MG PO TABS: 1.0000 | ORAL_TABLET | Freq: Every evening | ORAL | Status: DC | PRN

## 2018-02-25 MED ORDER — ACETAMINOPHEN 325 MG PO TABS
650.0000 mg | ORAL_TABLET | Freq: Four times a day (QID) | ORAL | Status: DC | PRN
  Administered 2018-02-26 – 2018-02-27 (×4): 650 mg via ORAL
  Filled 2018-02-25 (×4): qty 2

## 2018-02-25 MED ORDER — SODIUM CHLORIDE 0.9 % IV SOLN
INTRAVENOUS | Status: DC
  Administered 2018-02-25: 17:00:00 via INTRAVENOUS

## 2018-02-25 MED ORDER — HYDROMORPHONE HCL 1 MG/ML IJ SOLN
1.0000 mg | INTRAMUSCULAR | Status: DC | PRN
  Administered 2018-02-25 – 2018-02-26 (×3): 1 mg via INTRAVENOUS
  Filled 2018-02-25 (×3): qty 1

## 2018-02-25 MED ORDER — PHENAZOPYRIDINE HCL 100 MG PO TABS
100.0000 mg | ORAL_TABLET | Freq: Three times a day (TID) | ORAL | Status: DC | PRN
  Filled 2018-02-25: qty 1

## 2018-02-25 MED ORDER — ONDANSETRON HCL 4 MG/2ML IJ SOLN
4.0000 mg | Freq: Four times a day (QID) | INTRAMUSCULAR | Status: DC | PRN
  Administered 2018-02-25 – 2018-02-27 (×3): 4 mg via INTRAVENOUS
  Filled 2018-02-25 (×4): qty 2

## 2018-02-25 MED ORDER — SODIUM CHLORIDE 0.9 % IV SOLN
INTRAVENOUS | Status: DC
  Administered 2018-02-25: 16:00:00 via INTRAVENOUS

## 2018-02-25 MED ORDER — IOPAMIDOL (ISOVUE-300) INJECTION 61%
100.0000 mL | Freq: Once | INTRAVENOUS | Status: AC | PRN
  Administered 2018-02-25: 100 mL via INTRAVENOUS

## 2018-02-25 MED ORDER — ACETAMINOPHEN 650 MG RE SUPP: 650.0000 mg | Freq: Four times a day (QID) | RECTAL | Status: DC | PRN

## 2018-02-25 MED ORDER — TRAMADOL HCL 50 MG PO TABS
50.0000 mg | ORAL_TABLET | Freq: Four times a day (QID) | ORAL | Status: DC | PRN
  Administered 2018-02-25 – 2018-02-26 (×2): 50 mg via ORAL
  Filled 2018-02-25 (×2): qty 1

## 2018-02-25 MED ORDER — SODIUM CHLORIDE 0.9 % IV BOLUS
1000.0000 mL | Freq: Once | INTRAVENOUS | Status: AC
  Administered 2018-02-25 (×2): 1000 mL via INTRAVENOUS

## 2018-02-25 MED ORDER — ONDANSETRON HCL 4 MG PO TABS: 4.0000 mg | ORAL_TABLET | Freq: Four times a day (QID) | ORAL | Status: DC | PRN

## 2018-02-25 MED ORDER — HYDROMORPHONE HCL 1 MG/ML IJ SOLN
1.0000 mg | Freq: Once | INTRAMUSCULAR | Status: AC
  Administered 2018-02-25: 1 mg via INTRAVENOUS
  Filled 2018-02-25: qty 1

## 2018-02-25 MED ORDER — SODIUM CHLORIDE 0.9 % IV SOLN
INTRAVENOUS | Status: DC
  Administered 2018-02-25 – 2018-02-28 (×7): via INTRAVENOUS

## 2018-02-25 MED ORDER — PHENAZOPYRIDINE HCL 100 MG PO TABS
95.0000 mg | ORAL_TABLET | Freq: Three times a day (TID) | ORAL | Status: DC | PRN
  Filled 2018-02-25: qty 1

## 2018-02-25 MED ORDER — KETOROLAC TROMETHAMINE 30 MG/ML IJ SOLN
30.0000 mg | Freq: Four times a day (QID) | INTRAMUSCULAR | Status: DC | PRN
  Administered 2018-02-25 – 2018-02-27 (×4): 30 mg via INTRAVENOUS
  Filled 2018-02-25 (×4): qty 1

## 2018-02-25 MED ORDER — HYDROMORPHONE HCL 1 MG/ML IJ SOLN
1.0000 mg | Freq: Once | INTRAMUSCULAR | Status: AC
  Administered 2018-02-25: 1 mg via INTRAMUSCULAR
  Filled 2018-02-25: qty 1

## 2018-02-25 MED ORDER — SODIUM CHLORIDE 0.9 % IV SOLN
1.0000 g | INTRAVENOUS | Status: DC
  Administered 2018-02-25 – 2018-02-26 (×2): 1 g via INTRAVENOUS
  Filled 2018-02-25: qty 10
  Filled 2018-02-25: qty 1
  Filled 2018-02-25: qty 10

## 2018-02-25 NOTE — Progress Notes (Signed)
Resumed care for this pt at 2300. I agree with previous RN's assessment. Pt continues to c/o pain. PRNs given as prescribed. Will continue to monitor.

## 2018-02-25 NOTE — MAU Note (Signed)
Pt reports pain bilateral midback

## 2018-02-25 NOTE — MAU Provider Note (Addendum)
Chief Complaint: Headache; Back Pain; and Fever   First Provider Initiated Contact with Patient 02/25/18 1430      SUBJECTIVE HPI: Mandy Vasquez is a 27 y.o. [id number] nonpregnant female who presents to maternity admissions reporting back pain x 1 week worsening in last 2 days with addition of headache, fever, neck and chest pain, and n/v in last 24 hours.  Her back pain is low in her back, dull, and constant.  She has tried Tylenol and heat but nothing helps.  She started having other symptoms, like neck pain, chest pain, headache and fever yesterday, worsening today.  She denies dysuria, or other urinary symptoms. There are no other associated symptoms. She has not tried any other treatments.   HPI  Past Medical History:  Diagnosis Date  . Anxiety    History reviewed. No pertinent surgical history. Social History   Socioeconomic History  . Marital status: Single    Spouse name: Not on file  . Number of children: Not on file  . Years of education: Not on file  . Highest education level: Not on file  Occupational History  . Not on file  Social Needs  . Financial resource strain: Not on file  . Food insecurity:    Worry: Not on file    Inability: Not on file  . Transportation needs:    Medical: Not on file    Non-medical: Not on file  Tobacco Use  . Smoking status: Never Smoker  . Smokeless tobacco: Never Used  Substance and Sexual Activity  . Alcohol use: No  . Drug use: No  . Sexual activity: Yes  Lifestyle  . Physical activity:    Days per week: Not on file    Minutes per session: Not on file  . Stress: Not on file  Relationships  . Social connections:    Talks on phone: Not on file    Gets together: Not on file    Attends religious service: Not on file    Active member of club or organization: Not on file    Attends meetings of clubs or organizations: Not on file    Relationship status: Not on file  . Intimate partner violence:    Fear of current or ex  partner: Not on file    Emotionally abused: Not on file    Physically abused: Not on file    Forced sexual activity: Not on file  Other Topics Concern  . Not on file  Social History Narrative  . Not on file   No current facility-administered medications on file prior to encounter.    Current Outpatient Medications on File Prior to Encounter  Medication Sig Dispense Refill  . Ibuprofen-diphenhydrAMINE HCl (ADVIL PM) 200-25 MG CAPS Take 1 tablet by mouth as needed.    . phenazopyridine (PYRIDIUM) 95 MG tablet Take 95 mg by mouth 3 (three) times daily as needed for pain.     Allergies  Allergen Reactions  . Latex Swelling  . Onion Swelling    ROS:  Review of Systems  Constitutional: Positive for chills and fever. Negative for fatigue.  Respiratory: Positive for chest tightness. Negative for shortness of breath.   Cardiovascular: Positive for chest pain.  Gastrointestinal: Positive for nausea and vomiting.  Genitourinary: Negative for difficulty urinating, dysuria, flank pain, pelvic pain, vaginal bleeding, vaginal discharge and vaginal pain.  Musculoskeletal: Positive for back pain.  Neurological: Positive for headaches. Negative for dizziness.  Psychiatric/Behavioral: Negative.      I have  reviewed patient's Past Medical Hx, Surgical Hx, Family Hx, Social Hx, medications and allergies.   Physical Exam   Patient Vitals for the past 24 hrs:  BP Temp Pulse Resp SpO2 Height Weight  02/25/18 1612 114/64 98.8 F (37.1 C) 88 18 - - -  02/25/18 1430 - (!) 102.4 F (39.1 C) - - - - -  02/25/18 1319 - (!) 102.9 F (39.4 C) - - - - -  02/25/18 1303 100/62 100.3 F (37.9 C) (!) 105 20 100 % 5\' 10"  (1.778 m) 135 lb (61.2 kg)   Constitutional: Well-developed, well-nourished female in no acute distress.  Cardiovascular: normal rate Respiratory: normal effort GI: Abd soft, non-tender. Pos BS x 4 MS: Extremities nontender, no edema, normal ROM Neurologic: Alert and oriented x 4.   GU: Neg CVAT.  PELVIC EXAM: Deferred   LAB RESULTS Results for orders placed or performed during the hospital encounter of 02/25/18 (from the past 24 hour(s))  Urinalysis, Routine w reflex microscopic     Status: Abnormal   Collection Time: 02/25/18  1:11 PM  Result Value Ref Range   Color, Urine YELLOW YELLOW   APPearance HAZY (A) CLEAR   Specific Gravity, Urine 1.016 1.005 - 1.030   pH 5.0 5.0 - 8.0   Glucose, UA NEGATIVE NEGATIVE mg/dL   Hgb urine dipstick MODERATE (A) NEGATIVE   Bilirubin Urine NEGATIVE NEGATIVE   Ketones, ur NEGATIVE NEGATIVE mg/dL   Protein, ur 161 (A) NEGATIVE mg/dL   Nitrite POSITIVE (A) NEGATIVE   Leukocytes, UA MODERATE (A) NEGATIVE   RBC / HPF 6-10 0 - 5 RBC/hpf   WBC, UA >50 (H) 0 - 5 WBC/hpf   Bacteria, UA MANY (A) NONE SEEN   Squamous Epithelial / LPF 0-5 0 - 5   WBC Clumps PRESENT    Mucus PRESENT   Pregnancy, urine POC     Status: None   Collection Time: 02/25/18  1:14 PM  Result Value Ref Range   Preg Test, Ur NEGATIVE NEGATIVE  CBC with Differential/Platelet     Status: Abnormal   Collection Time: 02/25/18  1:36 PM  Result Value Ref Range   WBC 22.4 (H) 4.0 - 10.5 K/uL   RBC 4.16 3.87 - 5.11 MIL/uL   Hemoglobin 11.7 (L) 12.0 - 15.0 g/dL   HCT 09.6 (L) 04.5 - 40.9 %   MCV 83.2 78.0 - 100.0 fL   MCH 28.1 26.0 - 34.0 pg   MCHC 33.8 30.0 - 36.0 g/dL   RDW 81.1 91.4 - 78.2 %   Platelets 204 150 - 400 K/uL   Neutrophils Relative % 88 %   Neutro Abs 19.7 (H) 1.7 - 7.7 K/uL   Lymphocytes Relative 5 %   Lymphs Abs 1.1 0.7 - 4.0 K/uL   Monocytes Relative 7 %   Monocytes Absolute 1.7 (H) 0.1 - 1.0 K/uL   Eosinophils Relative 0 %   Eosinophils Absolute 0.0 0.0 - 0.7 K/uL   Basophils Relative 0 %   Basophils Absolute 0.0 0.0 - 0.1 K/uL  Lactic acid, plasma     Status: None   Collection Time: 02/25/18  2:30 PM  Result Value Ref Range   Lactic Acid, Venous 0.9 0.5 - 1.9 mmol/L  Culture, blood (routine x 2)     Status: None (Preliminary  result)   Collection Time: 02/25/18  2:30 PM  Result Value Ref Range   Specimen Description BLOOD LEFT ANTECUBITAL    Special Requests  BOTTLES DRAWN AEROBIC AND ANAEROBIC Performed at [facility], [address] [place], [place] [id number]    Culture PENDING    Report Status PENDING   Comprehensive metabolic panel     Status: Abnormal   Collection Time: 02/25/18  3:25 PM  Result Value Ref Range   Sodium 137 135 - 145 mmol/L   Potassium 2.9 (L) 3.5 - 5.1 mmol/L   Chloride 107 101 - 111 mmol/L   CO2 21 (L) 22 - 32 mmol/L   Glucose, Bld 107 (H) 65 - 99 mg/dL   BUN 8 6 - 20 mg/dL   Creatinine, Ser 6.040.75 0.44 - 1.00 mg/dL   Calcium 7.4 (L) 8.9 - 10.3 mg/dL   Total Protein 6.9 6.5 - 8.1 g/dL   Albumin 3.2 (L) 3.5 - 5.0 g/dL   AST 13 (L) 15 - 41 U/L   ALT 10 (L) 14 - 54 U/L   Alkaline Phosphatase 70 38 - 126 U/L   Total Bilirubin 0.8 0.3 - 1.2 mg/dL   GFR calc non Af Amer >60 >60 mL/min   GFR calc Af Amer >60 >60 mL/min   Anion gap 9 5 - 15    --/--/B POS, B POS (11/13 0035)  IMAGING No results found.  MAU Management/MDM: Pt with fever, low back pain, elevated WBCs, severe CVA tenderness, and with positive nitrites and other abnormalities in urine.  Pt likely developing sepsis from pyelonephritis. With n/v today, pt unlikely to tolerate PO antibiotics. CBC, CMP, lactic acid, blood cultures x 2, urine culture ordered.  Dilaudid 1 mg IV given for pain.  Consult Dr Alysia PennaErvin with assessment and findings.  Dr Alysia PennaErvin to bedside to see pt.  While labs pending, pt pain returned so second Dilaudid 1 mg IV given.   Sharen CounterLisa Leftwich-Kirby Certified Nurse-Midwife 02/25/2018  4:17 PM   OB/GYN Attending As noted above. Case discussed with Dr Marguerita Merlesmair [facility]. Bed coordinator and [facility] notified  Nettie ElmMichael Emelda Kohlbeck, MD

## 2018-02-25 NOTE — H&P (Signed)
History and Physical    [name] [name]:[id number] DOB: December 05, 1990 DOA: 02/25/2018  PCP: Patient, No Pcp Per   Patient coming from: Home  I have personally briefly reviewed patient's old medical records in [facility] Link  Chief Complaint: Fever and flank pain  HPI: Mandy Vasquez is a 27 y.o. female with history of anxiety presented with fever and right flank pain for the last few days associated with chills, increased frequency of urination but no hematuria along with nausea and very poor apetite.  Patient denies any recent sick contact, recent travel, rash.  Patient had fallen more than a week ago and landed on her back and started having back pain which was getting better with over-the-counter anti-inflammatory medication.  But she started having worsening right flank pain for the last few days, sharp in nature, severe intensity up to 10/10 with radiation to left side and no aggravating or relieving factors.  No loss of consciousness, seizures, chest pain, shortness of breath, palpitations.  ED Course: She was found to have leukocytosis with possible UTI.  She was given IV fluids and IV antibiotics.  Hospitalist service was called to evaluate the patient.  Review of Systems: As per HPI otherwise 10 point review of systems negative.    Past Medical History:  Diagnosis Date  . Anxiety    History reviewed. No pertinent surgical history. Social history  reports that she has never smoked. She has never used smokeless tobacco. She reports that she does not drink alcohol or use drugs.  -Lives at home with her family  Allergies  Allergen Reactions  . Latex Swelling  . Onion Swelling    Family History  Problem Relation Age of Onset  . Diabetes Maternal Aunt   . Heart disease Maternal Aunt   . Diabetes Maternal Grandmother   . Heart disease Maternal Grandmother     Prior to Admission medications   Medication Sig Start Date End Date Taking? Authorizing Provider    Ibuprofen-diphenhydrAMINE HCl (ADVIL PM) 200-25 MG CAPS Take 1 tablet by mouth as needed.   Yes [provider]  phenazopyridine (PYRIDIUM) 95 MG tablet Take 95 mg by mouth 3 (three) times daily as needed for pain.   Yes [provider]    Physical Exam: Vitals:   02/25/18 1319 02/25/18 1430 02/25/18 1612 02/25/18 1743  BP:   114/64 110/80  Pulse:   88 81  Resp:   18 20  Temp: (!) 102.9 F (39.4 C) (!) 102.4 F (39.1 C) 98.8 F (37.1 C) 98.5 F (36.9 C)  TempSrc:    Oral  SpO2:    100%  Weight:      Height:        Constitutional: Young female lying in bed in mild distress secondary to pain Vitals:   02/25/18 1319 02/25/18 1430 02/25/18 1612 02/25/18 1743  BP:   114/64 110/80  Pulse:   88 81  Resp:   18 20  Temp: (!) 102.9 F (39.4 C) (!) 102.4 F (39.1 C) 98.8 F (37.1 C) 98.5 F (36.9 C)  TempSrc:    Oral  SpO2:    100%  Weight:      Height:       Eyes: PERRL, lids and conjunctivae normal ENMT: Mucous membranes are dry. Posterior pharynx clear of any exudate or lesions.  Neck: normal, supple, no masses, no thyromegaly Respiratory: Bilateral decreased breath sounds at bases, no wheezing or crackles.  No accessory muscle use  cardiovascular: Regular rate and  rhythm, no murmurs / rubs / gallops. No extremity edema. Abdomen: no tenderness, no masses palpated. No hepatosplenomegaly. Bowel sounds positive.  Genitourinary: Right flank tenderness present Musculoskeletal: no clubbing / cyanosis. No joint deformity upper and lower extremities.  Skin: no rashes, lesions, ulcers. No induration Neurologic: CN 2-12 grossly intact.  Moving extremities.   Psychiatric: Normal judgment and insight. Alert and oriented x 3. Normal mood.     Labs on Admission: I have personally reviewed following labs and imaging studies  CBC: Recent Labs  Lab 02/25/18 1336  WBC 22.4*  NEUTROABS 19.7*  HGB 11.7*  HCT 34.6*  MCV 83.2  PLT 204   Basic Metabolic  Panel: Recent Labs  Lab 02/25/18 1525  NA 137  K 2.9*  CL 107  CO2 21*  GLUCOSE 107*  BUN 8  CREATININE 0.75  CALCIUM 7.4*   GFR: Estimated Creatinine Clearance: 103 mL/min (by C-G formula based on SCr of 0.75 mg/dL). Liver Function Tests: Recent Labs  Lab 02/25/18 1525  AST 13*  ALT 10*  ALKPHOS 70  BILITOT 0.8  PROT 6.9  ALBUMIN 3.2*   No results for input(s): LIPASE, AMYLASE in the last 168 hours. No results for input(s): AMMONIA in the last 168 hours. Coagulation Profile: No results for input(s): INR, PROTIME in the last 168 hours. Cardiac Enzymes: No results for input(s): CKTOTAL, CKMB, CKMBINDEX, TROPONINI in the last 168 hours. BNP (last 3 results) No results for input(s): PROBNP in the last 8760 hours. HbA1C: No results for input(s): HGBA1C in the last 72 hours. CBG: No results for input(s): GLUCAP in the last 168 hours. Lipid Profile: No results for input(s): CHOL, HDL, LDLCALC, TRIG, CHOLHDL, LDLDIRECT in the last 72 hours. Thyroid Function Tests: No results for input(s): TSH, T4TOTAL, FREET4, T3FREE, THYROIDAB in the last 72 hours. Anemia Panel: No results for input(s): VITAMINB12, FOLATE, FERRITIN, TIBC, IRON, RETICCTPCT in the last 72 hours. Urine analysis:    Component Value Date/Time   COLORURINE YELLOW 02/25/2018 1311   APPEARANCEUR HAZY (A) 02/25/2018 1311   LABSPEC 1.016 02/25/2018 1311   PHURINE 5.0 02/25/2018 1311   GLUCOSEU NEGATIVE 02/25/2018 1311   HGBUR MODERATE (A) 02/25/2018 1311   BILIRUBINUR NEGATIVE 02/25/2018 1311   KETONESUR NEGATIVE 02/25/2018 1311   PROTEINUR 100 (A) 02/25/2018 1311   NITRITE POSITIVE (A) 02/25/2018 1311   LEUKOCYTESUR MODERATE (A) 02/25/2018 1311    Radiological Exams on Admission: No results found.   Assessment/Plan Active Problems:   Pyelonephritis   Sepsis (HCC)   Sepsis secondary to acute pyelonephritis -Continue IV fluids, normal saline at 125 cc an hour.  Follow cultures.  Continue  antibiotics.    Probable acute pyelonephritis -Continue Rocephin.  Follow cultures.  Patient stated that she fell more than a week ago and is having worsening back pain.  Will get a CT scan of the abdomen and pelvis which will also comment about the lumbar spine. -Continue pain medications and antiemetics as needed  Leukocytosis -Probably secondary to above.  Continue antibiotics.  Repeat a.m. labs     DVT prophylaxis: None.  Early ablation Code Status: Full Family Communication: None at bedside Disposition Plan: Home in 2 to 3 days pending clinical improvement Consults called: None Admission status: Inpatient in telemetry  Severity of Illness: The appropriate patient status for this patient is INPATIENT. Inpatient status is judged to be reasonable and necessary in order to provide the required intensity of service to ensure the patient's safety. The patient's presenting symptoms, physical exam  findings, and initial radiographic and laboratory data in the context of their chronic comorbidities is felt to place them at high risk for further clinical deterioration. Furthermore, it is not anticipated that the patient will be medically stable for discharge from the hospital within 2 midnights of admission. The following factors support the patient status of inpatient.   " The patient's presenting symptoms include fever and right flank pain. " The worrisome physical exam findings include right flank tenderness and elevated temperature. " The initial radiographic and laboratory data are worrisome because of leukocytosis. " The chronic co-morbidities include none.   * I certify that at the point of admission it is my clinical judgment that the patient will require inpatient hospital care spanning beyond 2 midnights from the point of admission due to high intensity of service, high risk for further deterioration and high frequency of surveillance required.Glade Lloyd*     Dayton Sherr MD [facility] Pager [phone]- [phone]  If 7PM-7AM, please contact night-coverage [email or web address] Password [email or web address]  02/25/2018, 6:02 PM

## 2018-02-25 NOTE — MAU Note (Signed)
Report called to [facility]

## 2018-02-25 NOTE — Progress Notes (Signed)
Called by Dr. Chriss CzarMicheal Ervin for [facility]   27 year old Non-Pregnant year Female [facility] with Burning and Discomfort in Urine along with Flank Pain and Fever of 102.4, WBC 22.4 and UA Grossly abnormal for UTI. Suspected Pyleonephritis. HR 105. Blood Cx's ordered and Started on IV Ceftriaxone. Given 500 mL bolus and requested to have an additional Liter Bolus. CMP currently showing Hypokalemia. [facility] and need to obtain CT Imaging.

## 2018-02-26 ENCOUNTER — Inpatient Hospital Stay (HOSPITAL_COMMUNITY): Payer: Self-pay

## 2018-02-26 LAB — CSF CELL COUNT WITH DIFFERENTIAL
RBC Count, CSF: 4 /mm3 — ABNORMAL HIGH
TUBE #: 4
WBC, CSF: 1 /mm3 (ref 0–5)

## 2018-02-26 LAB — PROTEIN, CSF: TOTAL PROTEIN, CSF: 18 mg/dL (ref 15–45)

## 2018-02-26 LAB — COMPREHENSIVE METABOLIC PANEL
ALBUMIN: 3 g/dL — AB (ref 3.5–5.0)
ALT: 12 U/L — ABNORMAL LOW (ref 14–54)
ANION GAP: 7 (ref 5–15)
AST: 13 U/L — ABNORMAL LOW (ref 15–41)
Alkaline Phosphatase: 69 U/L (ref 38–126)
BUN: 7 mg/dL (ref 6–20)
CO2: 25 mmol/L (ref 22–32)
Calcium: 7.7 mg/dL — ABNORMAL LOW (ref 8.9–10.3)
Chloride: 106 mmol/L (ref 101–111)
Creatinine, Ser: 0.69 mg/dL (ref 0.44–1.00)
GFR calc Af Amer: >60 mL/min (ref >60)
GFR calc non Af Amer: >60 mL/min (ref >60)
GLUCOSE: 119 mg/dL — AB (ref 65–99)
POTASSIUM: 3.3 mmol/L — AB (ref 3.5–5.1)
SODIUM: 138 mmol/L (ref 135–145)
Total Bilirubin: 0.8 mg/dL (ref 0.3–1.2)
Total Protein: 6.2 g/dL — ABNORMAL LOW (ref 6.5–8.1)

## 2018-02-26 LAB — CBC
HCT: 30.1 % — ABNORMAL LOW (ref 36.0–46.0)
Hemoglobin: 9.8 g/dL — ABNORMAL LOW (ref 12.0–15.0)
MCH: 27.4 pg (ref 26.0–34.0)
MCHC: 32.6 g/dL (ref 30.0–36.0)
MCV: 84.1 fL (ref 78.0–100.0)
Platelets: 189 10*3/uL (ref 150–400)
RBC: 3.58 MIL/uL — ABNORMAL LOW (ref 3.87–5.11)
RDW: 13.9 % (ref 11.5–15.5)
WBC: 17.7 10*3/uL — ABNORMAL HIGH (ref 4.0–10.5)

## 2018-02-26 LAB — GLUCOSE, CSF: Glucose, CSF: 78 mg/dL — ABNORMAL HIGH (ref 40–70)

## 2018-02-26 MED ORDER — LORAZEPAM 2 MG/ML IJ SOLN: 1.0000 mg | INTRAMUSCULAR | Status: DC | PRN

## 2018-02-26 MED ORDER — GADOBENATE DIMEGLUMINE 529 MG/ML IV SOLN
15.0000 mL | Freq: Once | INTRAVENOUS | Status: AC | PRN
  Administered 2018-02-26: 13 mL via INTRAVENOUS

## 2018-02-26 MED ORDER — LIDOCAINE HCL 1 % IJ SOLN
INTRAMUSCULAR | Status: AC
  Filled 2018-02-26: qty 20

## 2018-02-26 MED ORDER — LORAZEPAM 2 MG/ML IJ SOLN
0.5000 mg | INTRAMUSCULAR | Status: DC | PRN
  Administered 2018-02-26: 0.5 mg via INTRAVENOUS
  Filled 2018-02-26: qty 1

## 2018-02-26 MED ORDER — MORPHINE SULFATE (PF) 2 MG/ML IV SOLN: 2.0000 mg | INTRAVENOUS | Status: DC | PRN

## 2018-02-26 MED ORDER — METHOCARBAMOL 500 MG PO TABS
500.0000 mg | ORAL_TABLET | Freq: Three times a day (TID) | ORAL | Status: DC
  Administered 2018-02-26 – 2018-02-28 (×7): 500 mg via ORAL
  Filled 2018-02-26 (×7): qty 1

## 2018-02-26 MED ORDER — HYDROMORPHONE HCL 1 MG/ML IJ SOLN
1.0000 mg | INTRAMUSCULAR | Status: DC | PRN
  Administered 2018-02-27: 1 mg via INTRAVENOUS
  Filled 2018-02-26: qty 1

## 2018-02-26 MED ORDER — LORAZEPAM 2 MG/ML IJ SOLN
1.0000 mg | INTRAMUSCULAR | Status: DC | PRN
  Administered 2018-02-26 (×2): 1 mg via INTRAVENOUS
  Filled 2018-02-26 (×2): qty 1

## 2018-02-26 MED ORDER — OXYCODONE-ACETAMINOPHEN 5-325 MG PO TABS
1.0000 | ORAL_TABLET | ORAL | Status: DC | PRN
Start: 2018-02-26 — End: 2018-02-28
  Administered 2018-02-26 – 2018-02-28 (×3): 1 via ORAL
  Filled 2018-02-26 (×3): qty 1

## 2018-02-26 NOTE — Plan of Care (Signed)
  Problem: Education: Goal: Knowledge of General Education information will improve Outcome: Progressing   Problem: Safety: Goal: Ability to remain free from injury will improve Outcome: Progressing   

## 2018-02-26 NOTE — Progress Notes (Signed)
Patient returned from MRI after receiving IV Ativan 0.5mg  and patient had a severe panic attack according to MRI technicians.  They recommended a higher dose of ativan or conscious sedation ([facility] MD was paged with update.  After discussing with patient she stated that she is not agreeable to conscious sedation but would consider a higher dose of IV medication. Will update MD with patient's request.

## 2018-02-26 NOTE — Progress Notes (Signed)
Patient has 102.3 fever and was given Tylenol. Upon assessment, patient stated she was having a severe amount of pain in the back of her neck and could barely turn her neck from side to side.  She stated that when she does a slow, steady turn of her neck the pain is severe and she sees white spots.  Patient did state that she fell a couple weeks ago after tripping over one of her children and ever since then her pain has become worse. Patient is also very unsteady on her feet needing assistance ambulating to the bathroom.  Patient stated she informed the MD regarding these findings but RN paged MD as well.

## 2018-02-26 NOTE — Progress Notes (Signed)
[facility] Progress Note  Patient: Mandy Vasquez ZOX:[id number]   PCP: Patient, No Pcp Per DOB: June 25, 1991   DOA: 02/25/2018   DOS: 02/26/2018   Date of Service: the patient was seen and examined on 02/26/2018  Subjective: Patient reports that she has pain in her lower back as well as neck. When she moves her neck side-to-side she starts seeing spots shining up in her eyes. She also complains of numbness in bilateral upper extremity as well as lower extremity primarily in her fingers and toes. Reports weakness and ataxia primarily affecting her right side, lower extremity more than the upper. She mentions that she had a fall last Monday, where she tripped over her daughter and fell on her bottom.  Since then she has progressively worsening back pain.  She denies having any head injury or neck injury but felt that she may have whiplash. Still reports nausea although she was ready to eat and requesting food, no vomiting.  No abdominal pain, no chest pain or shortness of breath. Has a significant anxiety and had a panic attack while in the MRI. Refuses conscious sedation.  Brief hospital course: Pt. with PMH of anxiety; admitted on 02/25/2018, presented with complaint of fever and flank pain, was found to have pyelonephritis.  Patient also reports right-sided weakness and numbness, ataxia, severe lower back pain as well as neck pain throbbing radiating to her head after a fall. Currently further plan is continue current antibiotic and consider further work-up.  Assessment and Plan: 1.  Sepsis secondary to acute pyelonephritis. Blood cultures so far are negative. Continue IV fluids. Continue IV ceftriaxone at current doses. Monitor urine culture. No stone on the CT scan of the abdomen. No obstruction noted. There are some areas which could suggest phlegmon but no identifiable drainable abscess. Lumbar puncture s negative for any evidence of pleocytosis at present.  No indication for CNS  meningitis coverage at present Continue current pain regimen and nausea management.  2.  Headache, neck pain with neck stiffness, bilateral lower back pain. Right sided paresthesias with weakness on the right lower extremity. Mild hyperreflexia. Her elbow symptoms can all simply be explained by severe case of pyelonephritis although possibility of acute nerve impingement or traumatic injury or infection-inflammation involving cord cannot be ruled out. Discussed with neurology recommend to continue with the plan for CT of the head and lumbar puncture along with MRI C-spine and L-spine with and without contrast. CT of the head is unremarkable, lumbar puncture does not show any pleocytosis, other work-up is currently pending.  Glucose is relatively low. Once the imaging is completed we will re-discuss with neurology for formal consult and further treatment and work-up. Currently keeping the patient n.p.o.  3.  Uncontrolled pain. Currently continuing Toradol, Dilaudid but would like to narrow it down further.  4.  Anemia. Baseline hemoglobin is  around 11. Current hemoglobin is 9.8 likely dilutional effect since also lines are down. Continue aggressive hydration but monitor his H&H as well.  Diet: N.p.o. until MRI is completed DVT Prophylaxis:mechanical compression device  Advance goals of care discussion: Full code  Family Communication: family was present at bedside, at the time of interview. The pt provided permission to discuss medical plan with the family. Opportunity was given to ask question and all questions were answered satisfactorily.   Disposition:  Discharge to be determined.  Consultants: none Procedures: LP  Antibiotics: Anti-infectives (From admission, onward)   Start     Dose/Rate Route Frequency Ordered Stop  02/25/18 1415  cefTRIAXone (ROCEPHIN) 1 g in sodium chloride 0.9 % 100 mL IVPB     1 g 200 mL/hr over 30 Minutes Intravenous Every 24 hours 02/25/18 1415           Objective: Physical Exam: Vitals:   02/25/18 2139 02/26/18 0523 02/26/18 0933 02/26/18 1313  BP: 114/77 113/81  119/90  Pulse: 75 80  83  Resp: 18 18  18   Temp: 99.3 F (37.4 C) 99 F (37.2 C) (!) 102.3 F (39.1 C) 99.1 F (37.3 C)  TempSrc: Oral Oral  Oral  SpO2: 100% 100%  97%  Weight:      Height:        Intake/Output Summary (Last 24 hours) at 02/26/2018 1502 Last data filed at 02/26/2018 1300 Gross per 24 hour  Intake 1000 ml  Output 1400 ml  Net -400 ml   Filed Weights   02/25/18 1303 02/25/18 1910  Weight: 61.2 kg (135 lb) 61.2 kg (135 lb)   General: Alert, Awake and Oriented to Time, Place and Person. Appear in marked distress, affect appropriate Eyes: PERRL, Conjunctiva normal ENT: Oral Mucosa clear dry. Neck: no JVD, no Abnormal Mass Or lumps Cardiovascular: S1 and S2 Present, no Murmur, Peripheral Pulses Present Respiratory: normal respiratory effort, Bilateral Air entry equal and Decreased, no use of accessory muscle, Clear to Auscultation, no Crackles, no wheezes Abdomen: Bowel Sound present, Soft and no tenderness, no hernia Bilateral CVA tenderness Skin: no redness, no Rash, no induration Extremities: No pedal edema, no calf tenderness Neurologic: Mental status AAOx3, speech normal, attention normal,  Cranial Nerves PERRL, EOM normal and present, facial sensation to light touch present, photophobia present, neck stiffness present Motor strength bilateral equal strength 4 x 5 in upper extremity,?  Lack of participation; 3 x 5 right lower extremity 4 x 5 left lower extremity, Hoover sign is negative Sensation present to light touch, left more than right Brisk reflexes present knee and biceps, babinski present,  Cerebellar test normal finger nose finger. Gait not checked due to patient safety concerns.  Data Reviewed: CBC: Recent Labs  Lab 02/25/18 1336 02/26/18 0443  WBC 22.4* 17.7*  NEUTROABS 19.7*  --   HGB 11.7* 9.8*  HCT 34.6* 30.1*   MCV 83.2 84.1  PLT 204 189   Basic Metabolic Panel: Recent Labs  Lab 02/25/18 1525 02/26/18 0443  NA 137 138  K 2.9* 3.3*  CL 107 106  CO2 21* 25  GLUCOSE 107* 119*  BUN 8 7  CREATININE 0.75 0.69  CALCIUM 7.4* 7.7*    Liver Function Tests: Recent Labs  Lab 02/25/18 1525 02/26/18 0443  AST 13* 13*  ALT 10* 12*  ALKPHOS 70 69  BILITOT 0.8 0.8  PROT 6.9 6.2*  ALBUMIN 3.2* 3.0*   No results for input(s): LIPASE, AMYLASE in the last 168 hours. No results for input(s): AMMONIA in the last 168 hours. Coagulation Profile: No results for input(s): INR, PROTIME in the last 168 hours. Cardiac Enzymes: No results for input(s): CKTOTAL, CKMB, CKMBINDEX, TROPONINI in the last 168 hours. BNP (last 3 results) No results for input(s): PROBNP in the last 8760 hours. CBG: No results for input(s): GLUCAP in the last 168 hours. Studies: Ct Head Wo Contrast  Result Date: 02/26/2018 CLINICAL DATA:  Headache, neck pain. Right side weakness and numbness. EXAM: CT HEAD WITHOUT CONTRAST TECHNIQUE: Contiguous axial images were obtained from the base of the skull through the vertex without intravenous contrast. COMPARISON:  12/11/2017 FINDINGS: Brain:  No acute intracranial abnormality. Specifically, no hemorrhage, hydrocephalus, mass lesion, acute infarction, or significant intracranial injury. Vascular: No hyperdense vessel or unexpected calcification. Skull: No acute calvarial abnormality. Sinuses/Orbits: Visualized paranasal sinuses and mastoids clear. Orbital soft tissues unremarkable. Other: None IMPRESSION: Normal study. Electronically Signed   By: Charlett NoseKevin  Dover M.D.   On: 02/26/2018 11:48   Ct Abdomen Pelvis W Contrast  Result Date: 02/25/2018 CLINICAL DATA:  Fever and right flank pain. EXAM: CT ABDOMEN AND PELVIS WITH CONTRAST TECHNIQUE: Multidetector CT imaging of the abdomen and pelvis was performed using the standard protocol following bolus administration of intravenous contrast.  CONTRAST:  100mL ISOVUE-300 IOPAMIDOL (ISOVUE-300) INJECTION 61% COMPARISON:  None. FINDINGS: Lower chest: Unremarkable. Hepatobiliary: No focal abnormality within the liver parenchyma. Gallbladder is distended but otherwise unremarkable. No intrahepatic or extrahepatic biliary dilation. Pancreas: No focal mass lesion. No dilatation of the main duct. No intraparenchymal cyst. No peripancreatic edema. Spleen: Tiny hypoattenuating lesion in the upper spleen is likely a tiny cyst or pseudocyst. Adrenals/Urinary Tract: No adrenal nodule or mass. Differential perfusion in the right kidney is segmental in appearance and suggests pyelonephritis. Possible small focus of differential enhancement in the upper pole of the right kidney is well. No evidence for hydroureteronephrosis. Bladder unremarkable. Stomach/Bowel: Stomach is nondistended. No gastric wall thickening. No evidence of outlet obstruction. Duodenum is normally positioned as is the ligament of Treitz. No small bowel wall thickening. No small bowel dilatation. The terminal ileum is normal. The appendix is not visualized, but there is no edema or inflammation in the region of the cecum. No gross colonic mass. No colonic wall thickening. No substantial diverticular change. Vascular/Lymphatic: Normal abdominal aorta. There is no gastrohepatic or hepatoduodenal ligament lymphadenopathy. No intraperitoneal or retroperitoneal lymphadenopathy. No pelvic sidewall lymphadenopathy. Reproductive: The uterus has normal CT imaging appearance. There is no adnexal mass. Other: Trace free fluid is noted in the cul-de-sac. Musculoskeletal: No worrisome lytic or sclerotic osseous abnormality. IMPRESSION: 1. Differential enhancement in the right kidney with segmental distribution. Imaging features suggest edema related to pyelonephritis. Some areas have a more rounded configuration and raise the question of associated phlegmon, but no overt intrarenal abscess evident at this time.  Potential small focus of involvement in the upper pole of the left kidney. 2. Otherwise unremarkable exam. Electronically Signed   By: Kennith CenterEric  Mansell M.D.   On: 02/25/2018 20:22   Dg Fluoro Guide Lumbar Puncture  Result Date: 02/26/2018 CLINICAL DATA:  Headache, fever, neck stiffness EXAM: DIAGNOSTIC LUMBAR PUNCTURE UNDER FLUOROSCOPIC GUIDANCE FLUOROSCOPY TIME:  Fluoroscopy Time:  12 seconds Radiation Exposure Index (if provided by the fluoroscopic device): 1.3 mGy Number of Acquired Spot Images: 0 PROCEDURE: Informed consent was obtained from the patient prior to the procedure, including potential complications of headache, allergy, and pain. With the patient prone, the lower back was prepped with Betadine. 1% Lidocaine was used for local anesthesia. Lumbar puncture was performed at the L3-4 level using a 20 gauge needle with return of clear CSF with an opening pressure of 23 cm water. 8-10 ml of CSF were obtained for laboratory studies. The patient tolerated the procedure well and there were no apparent complications. IMPRESSION: Technically successful lumbar puncture under fluoroscopic guidance as described above. Electronically Signed   By: Charlett NoseKevin  Dover M.D.   On: 02/26/2018 12:50    Scheduled Meds: . lidocaine      . methocarbamol  500 mg Oral TID   Continuous Infusions: . sodium chloride 125 mL/hr at 02/26/18 0839  . cefTRIAXone (  ROCEPHIN)  IV 1 g (02/26/18 1431)  . sodium chloride     PRN Meds: acetaminophen **OR** acetaminophen, HYDROmorphone (DILAUDID) injection, ketorolac, LORazepam, ondansetron **OR** ondansetron (ZOFRAN) IV, oxyCODONE-acetaminophen, phenazopyridine, senna-docusate  Time spent: 45 minutes  Author: Lynden Oxford, MD [facility] Pager: [phone] 02/26/2018 3:02 PM  If 7PM-7AM, please contact night-coverage at [email or web address], password [facility]

## 2018-02-26 NOTE — Progress Notes (Signed)
Assumed care of this patient by previous RN. I agree with prior assessment. Will continue to monitor patient.

## 2018-02-27 DIAGNOSIS — E876 Hypokalemia: Secondary | ICD-10-CM

## 2018-02-27 DIAGNOSIS — N10 Acute tubulo-interstitial nephritis: Secondary | ICD-10-CM

## 2018-02-27 DIAGNOSIS — A419 Sepsis, unspecified organism: Principal | ICD-10-CM

## 2018-02-27 LAB — CBC WITH DIFFERENTIAL/PLATELET
Basophils Absolute: 0 10*3/uL (ref 0.0–0.1)
Basophils Relative: 0 %
Eosinophils Absolute: 0.1 10*3/uL (ref 0.0–0.7)
Eosinophils Relative: 1 %
HEMATOCRIT: 29.6 % — AB (ref 36.0–46.0)
HEMOGLOBIN: 9.7 g/dL — AB (ref 12.0–15.0)
LYMPHS ABS: 1.8 10*3/uL (ref 0.7–4.0)
LYMPHS PCT: 14 %
MCH: 27.5 pg (ref 26.0–34.0)
MCHC: 32.8 g/dL (ref 30.0–36.0)
MCV: 83.9 fL (ref 78.0–100.0)
MONOS PCT: 8 %
Monocytes Absolute: 1.1 10*3/uL — ABNORMAL HIGH (ref 0.1–1.0)
NEUTROS ABS: 9.6 10*3/uL — AB (ref 1.7–7.7)
NEUTROS PCT: 77 %
Platelets: 188 10*3/uL (ref 150–400)
RBC: 3.53 MIL/uL — AB (ref 3.87–5.11)
RDW: 14.1 % (ref 11.5–15.5)
WBC: 12.5 10*3/uL — AB (ref 4.0–10.5)

## 2018-02-27 LAB — BASIC METABOLIC PANEL
Anion gap: 9 (ref 5–15)
BUN: 6 mg/dL (ref 6–20)
CHLORIDE: 106 mmol/L (ref 101–111)
CO2: 25 mmol/L (ref 22–32)
CREATININE: 0.63 mg/dL (ref 0.44–1.00)
Calcium: 8.3 mg/dL — ABNORMAL LOW (ref 8.9–10.3)
GFR calc Af Amer: >60 mL/min (ref >60)
GFR calc non Af Amer: >60 mL/min (ref >60)
Glucose, Bld: 97 mg/dL (ref 65–99)
Potassium: 3 mmol/L — ABNORMAL LOW (ref 3.5–5.1)
SODIUM: 140 mmol/L (ref 135–145)

## 2018-02-27 LAB — URINE CULTURE

## 2018-02-27 LAB — RAPID URINE DRUG SCREEN, HOSP PERFORMED
AMPHETAMINES: NOT DETECTED
BARBITURATES: NOT DETECTED
Benzodiazepines: POSITIVE — AB
Cocaine: NOT DETECTED
OPIATES: POSITIVE — AB
TETRAHYDROCANNABINOL: POSITIVE — AB

## 2018-02-27 LAB — VDRL, CSF: VDRL Quant, CSF: NONREACTIVE

## 2018-02-27 MED ORDER — POTASSIUM CHLORIDE 10 MEQ/100ML IV SOLN
10.0000 meq | INTRAVENOUS | Status: DC
  Administered 2018-02-27: 10 meq via INTRAVENOUS
  Filled 2018-02-27 (×2): qty 100

## 2018-02-27 MED ORDER — POTASSIUM CHLORIDE CRYS ER 20 MEQ PO TBCR
40.0000 meq | EXTENDED_RELEASE_TABLET | Freq: Once | ORAL | Status: AC
  Administered 2018-02-27: 40 meq via ORAL
  Filled 2018-02-27: qty 2

## 2018-02-27 MED ORDER — SODIUM CHLORIDE 0.9 % IV SOLN
2.0000 g | INTRAVENOUS | Status: DC
  Administered 2018-02-27: 2 g via INTRAVENOUS
  Filled 2018-02-27: qty 20
  Filled 2018-02-27: qty 2

## 2018-02-27 NOTE — Care Management Note (Signed)
Case Management Note  Patient Details  Name: Mandy Vasquez MRN: [id number] Date of Birth: 22-Aug-1991  Subjective/Objective:  27 y/o f admitted w/pyelonephritis. From home. No pcp-provided w/pcp listing encouraged [facility]-patient will make own appt-informed her she can get her meds there also,provided w/[facility] $4 med list.  No further CM needs.               Action/Plan:d/c home.   Expected Discharge Date:                  Expected Discharge Plan:  Home/Self Care  In-House Referral:     Discharge planning Services  CM Consult, [facility]igent [facility]  Post Acute Care Choice:    Choice offered to:     DME Arranged:    DME Agency:     HH Arranged:    HH Agency:     Status of Service:  In process, will continue to follow  If discussed at Long Length of Stay Meetings, dates discussed:    Additional Comments:  Lanier ClamMahabir, Aianna Fahs, RN 02/27/2018, 3:28 PM

## 2018-02-27 NOTE — Progress Notes (Signed)
PROGRESS NOTE  Rikayla Furber ZOX:[id number] DOB: 06/26/1991 DOA: 02/25/2018 PCP: Patient, No Pcp Per  HPI/Recap of past 24 hours:  Last fever on 6/11 at 3pm, she does not feel better, she vomited x4 this am  Assessment/Plan: Active Problems:   Pyelonephritis   Sepsis (HCC)   Sepsis with right-sided pyelonephritis -Presented with fever, leukocytosis, tachycardia -ct ab/pel "Differential enhancement in the right kidney with segmental distribution. Imaging features suggest edema related to pyelonephritis. Some areas have a more rounded configuration and raise the question of associated phlegmon, but no overt intrarenal abscess evident at this time. Potential small focus of involvement in the upper pole of the left kidney. 2. Otherwise unremarkable exam." -ua+ bacteriuria,Urine culture multiple species, blood culture no growth -Continue her flank pain, n/v, increase Rocephin from 1 g daily to 2 g daily -Continue IV fluids -consider repeat ct ab if does not improve in the next 24hrs  Hypokalemia -k3, from nausea vomiting -Replace potassium through IV, check mag, keep on tele.  Headache and neck pain, right sided weakness -CT abdomen no acute -Lumbar puncture with clear CSF WBC 1, culture no growth -supportive measures  Normocytic anemia: -No overt sign of bleed -follow up with pmd  Social issues:  Patient reported relocated to [facility] from [place] a year ago, has no insurance no primary care doctor Is manager consulted   Code Status: full  Family Communication: patient   Disposition Plan: not ready to discharge   Consultants:  Case manager  Procedures:  Lumbar puncture on 6/11  Antibiotics:  rocephin   Objective: BP 112/79 (BP Location: Left Arm)   Pulse 65   Temp 97.6 F (36.4 C) (Oral)   Resp 16   Ht 5\' 10"  (1.778 m)   Wt 61.2 kg (135 lb)   LMP 02/18/2018   SpO2 100%   BMI 19.37 kg/m   Intake/Output Summary (Last 24 hours) at 02/27/2018  1427 Last data filed at 02/26/2018 1800 Gross per 24 hour  Intake 3416.67 ml  Output 400 ml  Net 3016.67 ml   Filed Weights   02/25/18 1303 02/25/18 1910  Weight: 61.2 kg (135 lb) 61.2 kg (135 lb)    Exam: Patient is examined daily including today on 02/27/2018, exams remain the same as of yesterday except that has changed    General:  Dose not look comfortable  Cardiovascular: RRR  Respiratory: CTABL  Abdomen: Soft/ND/NT, positive BS, right flank  tenderness  Musculoskeletal: No Edema  Neuro: alert, oriented   Data Reviewed: Basic Metabolic Panel: Recent Labs  Lab 02/25/18 1525 02/26/18 0443 02/27/18 1143  NA 137 138 140  K 2.9* 3.3* 3.0*  CL 107 106 106  CO2 21* 25 25  GLUCOSE 107* 119* 97  BUN 8 7 6   CREATININE 0.75 0.69 0.63  CALCIUM 7.4* 7.7* 8.3*   Liver Function Tests: Recent Labs  Lab 02/25/18 1525 02/26/18 0443  AST 13* 13*  ALT 10* 12*  ALKPHOS 70 69  BILITOT 0.8 0.8  PROT 6.9 6.2*  ALBUMIN 3.2* 3.0*   No results for input(s): LIPASE, AMYLASE in the last 168 hours. No results for input(s): AMMONIA in the last 168 hours. CBC: Recent Labs  Lab 02/25/18 1336 02/26/18 0443 02/27/18 1143  WBC 22.4* 17.7* 12.5*  NEUTROABS 19.7*  --  9.6*  HGB 11.7* 9.8* 9.7*  HCT 34.6* 30.1* 29.6*  MCV 83.2 84.1 83.9  PLT 204 189 188   Cardiac Enzymes:   No results for input(s): CKTOTAL, CKMB, CKMBINDEX,  TROPONINI in the last 168 hours. BNP (last 3 results) No results for input(s): BNP in the last 8760 hours.  ProBNP (last 3 results) No results for input(s): PROBNP in the last 8760 hours.  CBG: No results for input(s): GLUCAP in the last 168 hours.  Recent Results (from the past 240 hour(s))  Culture, blood (routine x 2)     Status: None (Preliminary result)   Collection Time: 02/25/18  2:30 PM  Result Value Ref Range Status   Specimen Description BLOOD LEFT ANTECUBITAL  Final   Special Requests   Final    BOTTLES DRAWN AEROBIC AND  ANAEROBIC Performed at [facility], [address] [place], [place] [address]    Culture   Final    NO GROWTH 2 DAYS Performed at [facility], [address], [place], [place] [address]    Report Status PENDING  Incomplete  Culture, blood (routine x 2)     Status: None (Preliminary result)   Collection Time: 02/25/18  2:38 PM  Result Value Ref Range Status   Specimen Description   Final    BLOOD LEFT HAND Performed at [facility], [address] [place], [place] [address]    Special Requests   Final    BOTTLES DRAWN AEROBIC AND ANAEROBIC Performed at [facility], [address], [place], [place] [address]    Culture   Final    NO GROWTH 2 DAYS Performed at [facility], [address] [place], [place] [address]    Report Status PENDING  Incomplete  Urine culture     Status: Abnormal   Collection Time: 02/25/18 11:32 PM  Result Value Ref Range Status   Specimen Description   Final    URINE, CLEAN CATCH Performed at [facility], [address], [place], [place] [address]    Special Requests   Final    NONE Performed at [facility], [address], [place], [place] [address]    Culture MULTIPLE ORGANISMS PRESENT, NONE PREDOMINANT (A)  Final   Report Status 02/27/2018 FINAL  Final  CSF culture     Status: None (Preliminary result)   Collection Time: 02/26/18 12:38 PM  Result Value Ref Range Status   Specimen Description   Final    CSF Performed at [facility], [address] [place], [place], [place] [address]    Special Requests   Final    NONE Performed at [facility], [address], [place], [place] [address]    Gram Stain   Final    WBC PRESENT, PREDOMINANTLY MONONUCLEAR NO ORGANISMS SEEN CYTOSPIN SMEAR Performed at [facility], [address], [place], [place] [address]    Culture NO GROWTH < 24 HOURS  Final   Report Status PENDING  Incomplete      Studies: Mr Cervical Spine W Wo Contrast  Result Date: 02/26/2018 CLINICAL DATA:  Neck and low back pain. Bilateral upper and lower extremity numbness. Predominantly right-sided weakness and ataxia. Recent fall. Fever. EXAM: MRI CERVICAL SPINE WITHOUT AND WITH CONTRAST TECHNIQUE: Multiplanar and multiecho pulse sequences of the cervical spine, to include the craniocervical junction and cervicothoracic junction, were obtained without and with intravenous contrast. CONTRAST:  13mL MULTIHANCE GADOBENATE DIMEGLUMINE 529 MG/ML IV SOLN COMPARISON:  Cervical spine CT 12/11/2017 FINDINGS: The axial postcontrast T1 sequence is severely motion degraded. Mild-to-moderate motion is present on multiple other sequences. Alignment: Slight reversal of the normal cervical lordosis. No listhesis.  Vertebrae: No fracture, suspicious osseous lesion, or significant marrow edema. Cord: Normal signal and morphology. No abnormal intradural enhancement. Posterior Fossa, vertebral arteries, paraspinal tissues: Unremarkable. Disc levels: Preserved disc space heights. No disc herniation identified. Widely patent spinal canal and neural foramina. IMPRESSION: No significant abnormality identified within limitations of motion artifact. Electronically Signed   By: Sebastian AcheAllen  Grady M.D.   On: 02/26/2018 17:46   Mr Lumbar Spine W Wo Contrast  Result Date: 02/26/2018 CLINICAL DATA:  Neck and low back pain. Bilateral upper and lower extremity numbness. Predominantly right-sided weakness and ataxia. Recent fall. Fever. EXAM: MRI LUMBAR SPINE WITHOUT AND WITH CONTRAST TECHNIQUE: Multiplanar and multiecho pulse sequences of the lumbar spine were obtained without and with intravenous contrast. CONTRAST:  13mL MULTIHANCE GADOBENATE DIMEGLUMINE 529 MG/ML IV SOLN COMPARISON:  CT abdomen and pelvis 02/25/2018 FINDINGS: The axial T1 postcontrast sequence is severely motion degraded, and there is mild-to-moderate motion on multiple other sequences.  Segmentation: Standard. Alignment:  Normal. Vertebrae: No fracture, suspicious osseous lesion, or significant marrow edema. No evidence of discitis. Conus medullaris and cauda equina: Conus extends to the L1-2 level. Conus and cauda equina appear normal. Paraspinal and other soft tissues: Renal abnormalities better evaluated on recent CT. No paraspinal fluid collection. Disc levels: Preserved disc height and hydration throughout the lumbar spine. No disc herniation identified. Widely patent spinal canal and neural foramina throughout. IMPRESSION: Unremarkable appearance of the lumbar spine within limitations of motion artifact. Electronically Signed   By: Sebastian AcheAllen  Grady M.D.   On: 02/26/2018 17:49    Scheduled Meds: . methocarbamol  500 mg Oral TID    Continuous Infusions: . sodium chloride 125 mL/hr at 02/27/18 0533  . cefTRIAXone (ROCEPHIN)  IV    . potassium chloride    . sodium chloride       Time spent: 25mins I have personally reviewed and interpreted on  02/27/2018 daily labs, tele strips, imagings as discussed above under date review session and assessment and plans.  I reviewed all nursing notes, pharmacy notes,   vitals, pertinent old records  I have discussed plan of care as described above with RN , patient on 02/27/2018   Albertine GratesFang Natsuko Kelsay MD, PhD  [facility] Pager [phone]. If 7PM-7AM, please contact night-coverage at [email or web address], password [email or web address] 02/27/2018, 2:27 PM  LOS: 2 days

## 2018-02-28 DIAGNOSIS — D649 Anemia, unspecified: Secondary | ICD-10-CM

## 2018-02-28 LAB — LACTIC ACID, PLASMA: LACTIC ACID, VENOUS: 0.9 mmol/L (ref 0.5–1.9)

## 2018-02-28 LAB — CBC WITH DIFFERENTIAL/PLATELET
BASOS ABS: 0 10*3/uL (ref 0.0–0.1)
BASOS PCT: 0 %
EOS ABS: 0.1 10*3/uL (ref 0.0–0.7)
EOS PCT: 1 %
HEMATOCRIT: 26.7 % — AB (ref 36.0–46.0)
Hemoglobin: 9 g/dL — ABNORMAL LOW (ref 12.0–15.0)
LYMPHS PCT: 24 %
Lymphs Abs: 2.3 10*3/uL (ref 0.7–4.0)
MCH: 28 pg (ref 26.0–34.0)
MCHC: 33.7 g/dL (ref 30.0–36.0)
MCV: 82.9 fL (ref 78.0–100.0)
MONO ABS: 1.2 10*3/uL (ref 0.1–1.0)
Monocytes Relative: 13 %
Neutro Abs: 6.1 10*3/uL (ref 1.7–7.7)
Neutrophils Relative %: 62 %
PLATELETS: 214 10*3/uL (ref 150–400)
RBC: 3.22 MIL/uL — ABNORMAL LOW (ref 3.87–5.11)
RDW: 13.9 % (ref 11.5–15.5)
WBC: 9.8 10*3/uL (ref 4.0–10.5)

## 2018-02-28 LAB — BASIC METABOLIC PANEL
ANION GAP: 10 (ref 5–15)
BUN: 6 mg/dL (ref 6–20)
CHLORIDE: 107 mmol/L (ref 101–111)
CO2: 23 mmol/L (ref 22–32)
Calcium: 8.1 mg/dL — ABNORMAL LOW (ref 8.9–10.3)
Creatinine, Ser: 0.59 mg/dL (ref 0.44–1.00)
Glucose, Bld: 101 mg/dL — ABNORMAL HIGH (ref 65–99)
POTASSIUM: 3.3 mmol/L — AB (ref 3.5–5.1)
Sodium: 140 mmol/L (ref 135–145)

## 2018-02-28 LAB — MAGNESIUM: MAGNESIUM: 1.8 mg/dL (ref 1.7–2.4)

## 2018-02-28 LAB — OLIGOCLONAL BANDS, CSF + SERM

## 2018-02-28 MED ORDER — CEPHALEXIN 500 MG PO CAPS: 500.0000 mg | ORAL_CAPSULE | Freq: Three times a day (TID) | ORAL | 0 refills | Status: AC

## 2018-02-28 MED ORDER — POTASSIUM CHLORIDE ER 10 MEQ PO TBCR: 10.0000 meq | EXTENDED_RELEASE_TABLET | Freq: Every day | ORAL | 0 refills | Status: DC

## 2018-02-28 MED ORDER — POTASSIUM CHLORIDE CRYS ER 20 MEQ PO TBCR
40.0000 meq | EXTENDED_RELEASE_TABLET | Freq: Once | ORAL | Status: AC
  Administered 2018-02-28: 40 meq via ORAL
  Filled 2018-02-28 (×2): qty 2

## 2018-02-28 MED ORDER — MAGNESIUM GLUCONATE 30 MG PO TABS: 30.0000 mg | ORAL_TABLET | Freq: Every day | ORAL | 0 refills | Status: DC

## 2018-02-28 MED ORDER — METHOCARBAMOL 500 MG PO TABS: 500.0000 mg | ORAL_TABLET | Freq: Three times a day (TID) | ORAL | 0 refills | Status: DC | PRN

## 2018-02-28 NOTE — Discharge Summary (Signed)
Discharge Summary  Mandy Vasquez ZOX:[id number] DOB: 1991-01-21  PCP: Patient, No Pcp Per  Admit date: 02/25/2018 Discharge date: 02/28/2018  Time spent: <23mins  Recommendations for Outpatient Follow-up:  1. F/u with PMD within a week  for hospital discharge follow up, repeat cbc/bmp at follow up.  Discharge Diagnoses:  Active Hospital Problems   Diagnosis Date Noted  . Pyelonephritis 02/25/2018  . Sepsis (HCC) 02/25/2018    Resolved Hospital Problems  No resolved problems to display.    Discharge Condition: stable  Diet recommendation: regular diet  Filed Weights   02/25/18 1303 02/25/18 1910  Weight: 61.2 kg (135 lb) 61.2 kg (135 lb)    History of present illness: (per admitting MD Dr Hanley Ben) PCP: Patient, No Pcp Per   Patient coming from: Home  I have personally briefly reviewed patient's old medical records in [facility]  Chief Complaint: Fever and flank pain  HPI: Mandy Vasquez is a 27 y.o. female with history of anxiety presented with fever and right flank pain for the last few days associated with chills, increased frequency of urination but no hematuria along with nausea and very poor apetite.  Patient denies any recent sick contact, recent travel, rash.  Patient had fallen more than a week ago and landed on her back and started having back pain which was getting better with over-the-counter anti-inflammatory medication.  But she started having worsening right flank pain for the last few days, sharp in nature, severe intensity up to 10/10 with radiation to left side and no aggravating or relieving factors.  No loss of consciousness, seizures, chest pain, shortness of breath, palpitations.  ED Course: She was found to have leukocytosis with possible UTI.  She was given IV fluids and IV antibiotics.  Hospitalist service was called to evaluate the patient.    Hospital Course:  Active Problems:   Pyelonephritis   Sepsis (HCC)   Sepsis with  right-sided pyelonephritis -Presented with fever, leukocytosis, tachycardia -ct ab/pel "Differential enhancement in the right kidney with segmental distribution. Imaging features suggest edema related to pyelonephritis. Some areas have a more rounded configuration and raise the question of associated phlegmon, but no overt intrarenal abscess evident at this time. Potential small focus of involvement in the upper pole of the left kidney. 2. Otherwise unremarkable exam." -ua+ bacteriuria,Urine culture multiple species, blood culture no growth -she received Rocephin iv, and ivf, symptom has resolved, wbc normalized. She is discharged on keflex for 10 more days to finish total of 14 days abx treatment, she is to follow up with pcp.   Hypokalemia -k3, from nausea vomiting -replaced, she is discharged on k/mag supplement, pcp follow up to repeat labs  Headache and neck pain, right sided weakness -CT abdomen no acute -Lumbar puncture with clear CSF WBC 1, culture no growth -supportive measures -symptom resolved  Normocytic anemia: -No overt sign of bleed -follow up with pmd  Social issues:  Patient reported relocated to [facility] from [place] a year ago, has no insurance no primary care doctor Case manager consulted, [facility] info provided   Code Status: full  Family Communication: patient   Disposition Plan: d/c home   Consultants:  Case manager  Procedures:  Lumbar puncture on 6/11  Antibiotics:  rocephin   Discharge Exam: BP 125/76 (BP Location: Left Arm)   Pulse 63   Temp 98.8 F (37.1 C) (Oral)   Resp 14   Ht 5\' 10"  (1.778 m)   Wt 61.2 kg (135 lb)  LMP 02/18/2018   SpO2 100%   BMI 19.37 kg/m   General: NAD Cardiovascular: RRR Respiratory: CTABL  Discharge Instructions You were cared for by a hospitalist during your hospital stay. If you have any questions about your discharge medications or the care you received  while you were in the hospital after you are discharged, you can call the unit and asked to speak with the hospitalist on call if the hospitalist that took care of you is not available. Once you are discharged, your primary care physician will handle any further medical issues. Please note that NO REFILLS for any discharge medications will be authorized once you are discharged, as it is imperative that you return to your primary care physician (or establish a relationship with a primary care physician if you do not have one) for your aftercare needs so that they can reassess your need for medications and monitor your lab values.  Discharge Instructions    Diet general   Complete by:  As directed    Increase activity slowly   Complete by:  As directed      Allergies as of 02/28/2018      Reactions   Latex Swelling   Onion Swelling      Medication List    TAKE these medications   ADVIL PM 200-25 MG Caps Generic drug:  Ibuprofen-diphenhydrAMINE HCl Take 1 tablet by mouth as needed.   cephALEXin 500 MG capsule Commonly known as:  KEFLEX Take 1 capsule (500 mg total) by mouth 3 (three) times daily for 10 days.   magnesium gluconate 30 MG tablet Commonly known as:  MAGONATE Take 1 tablet (30 mg total) by mouth daily.   methocarbamol 500 MG tablet Commonly known as:  ROBAXIN Take 1 tablet (500 mg total) by mouth every 8 (eight) hours as needed for muscle spasms.   phenazopyridine 95 MG tablet Commonly known as:  PYRIDIUM Take 95 mg by mouth 3 (three) times daily as needed for pain.   potassium chloride 10 MEQ tablet Commonly known as:  K-DUR Take 1 tablet (10 mEq total) by mouth daily.      Allergies  Allergen Reactions  . Latex Swelling  . Onion Swelling   Follow-up Information    [facility]. Schedule an appointment as soon as possible for a visit in 1 week(s).   Why:  hospital discharge follow up, repeat cbc/bmp at follow up. Contact  information: [address] [place] [phone]           The results of significant diagnostics from this hospitalization (including imaging, microbiology, ancillary and laboratory) are listed below for reference.    Significant Diagnostic Studies: Ct Head Wo Contrast  Result Date: 02/26/2018 CLINICAL DATA:  Headache, neck pain. Right side weakness and numbness. EXAM: CT HEAD WITHOUT CONTRAST TECHNIQUE: Contiguous axial images were obtained from the base of the skull through the vertex without intravenous contrast. COMPARISON:  12/11/2017 FINDINGS: Brain: No acute intracranial abnormality. Specifically, no hemorrhage, hydrocephalus, mass lesion, acute infarction, or significant intracranial injury. Vascular: No hyperdense vessel or unexpected calcification. Skull: No acute calvarial abnormality. Sinuses/Orbits: Visualized paranasal sinuses and mastoids clear. Orbital soft tissues unremarkable. Other: None IMPRESSION: Normal study. Electronically Signed   By: Charlett NoseKevin  Dover M.D.   On: 02/26/2018 11:48   Mr Cervical Spine W Wo Contrast  Result Date: 02/26/2018 CLINICAL DATA:  Neck and low back pain. Bilateral upper and lower extremity numbness. Predominantly right-sided weakness and ataxia.  Recent fall. Fever. EXAM: MRI CERVICAL SPINE WITHOUT AND WITH CONTRAST TECHNIQUE: Multiplanar and multiecho pulse sequences of the cervical spine, to include the craniocervical junction and cervicothoracic junction, were obtained without and with intravenous contrast. CONTRAST:  13mL MULTIHANCE GADOBENATE DIMEGLUMINE 529 MG/ML IV SOLN COMPARISON:  Cervical spine CT 12/11/2017 FINDINGS: The axial postcontrast T1 sequence is severely motion degraded. Mild-to-moderate motion is present on multiple other sequences. Alignment: Slight reversal of the normal cervical lordosis. No listhesis. Vertebrae: No fracture, suspicious osseous lesion, or significant marrow edema. Cord: Normal  signal and morphology. No abnormal intradural enhancement. Posterior Fossa, vertebral arteries, paraspinal tissues: Unremarkable. Disc levels: Preserved disc space heights. No disc herniation identified. Widely patent spinal canal and neural foramina. IMPRESSION: No significant abnormality identified within limitations of motion artifact. Electronically Signed   By: Sebastian Ache M.D.   On: 02/26/2018 17:46   Mr Lumbar Spine W Wo Contrast  Result Date: 02/26/2018 CLINICAL DATA:  Neck and low back pain. Bilateral upper and lower extremity numbness. Predominantly right-sided weakness and ataxia. Recent fall. Fever. EXAM: MRI LUMBAR SPINE WITHOUT AND WITH CONTRAST TECHNIQUE: Multiplanar and multiecho pulse sequences of the lumbar spine were obtained without and with intravenous contrast. CONTRAST:  13mL MULTIHANCE GADOBENATE DIMEGLUMINE 529 MG/ML IV SOLN COMPARISON:  CT abdomen and pelvis 02/25/2018 FINDINGS: The axial T1 postcontrast sequence is severely motion degraded, and there is mild-to-moderate motion on multiple other sequences. Segmentation: Standard. Alignment:  Normal. Vertebrae: No fracture, suspicious osseous lesion, or significant marrow edema. No evidence of discitis. Conus medullaris and cauda equina: Conus extends to the L1-2 level. Conus and cauda equina appear normal. Paraspinal and other soft tissues: Renal abnormalities better evaluated on recent CT. No paraspinal fluid collection. Disc levels: Preserved disc height and hydration throughout the lumbar spine. No disc herniation identified. Widely patent spinal canal and neural foramina throughout. IMPRESSION: Unremarkable appearance of the lumbar spine within limitations of motion artifact. Electronically Signed   By: Sebastian Ache M.D.   On: 02/26/2018 17:49   Ct Abdomen Pelvis W Contrast  Result Date: 02/25/2018 CLINICAL DATA:  Fever and right flank pain. EXAM: CT ABDOMEN AND PELVIS WITH CONTRAST TECHNIQUE: Multidetector CT imaging of the  abdomen and pelvis was performed using the standard protocol following bolus administration of intravenous contrast. CONTRAST:  ISOVUE-300 IOPAMIDOL (ISOVUE-300) INJECTION 61% COMPARISON:  None. FINDINGS: Lower chest: Unremarkable. Hepatobiliary: No focal abnormality within the liver parenchyma. Gallbladder is distended but otherwise unremarkable. No intrahepatic or extrahepatic biliary dilation. Pancreas: No focal mass lesion. No dilatation of the main duct. No intraparenchymal cyst. No peripancreatic edema. Spleen: Tiny hypoattenuating lesion in the upper spleen is likely a tiny cyst or pseudocyst. Adrenals/Urinary Tract: No adrenal nodule or mass. Differential perfusion in the right kidney is segmental in appearance and suggests pyelonephritis. Possible small focus of differential enhancement in the upper pole of the right kidney is well. No evidence for hydroureteronephrosis. Bladder unremarkable. Stomach/Bowel: Stomach is nondistended. No gastric wall thickening. No evidence of outlet obstruction. Duodenum is normally positioned as is the ligament of Treitz. No small bowel wall thickening. No small bowel dilatation. The terminal ileum is normal. The appendix is not visualized, but there is no edema or inflammation in the region of the cecum. No gross colonic mass. No colonic wall thickening. No substantial diverticular change. Vascular/Lymphatic: Normal abdominal aorta. There is no gastrohepatic or hepatoduodenal ligament lymphadenopathy. No intraperitoneal or retroperitoneal lymphadenopathy. No pelvic sidewall lymphadenopathy. Reproductive: The uterus has normal CT imaging appearance. There is no  adnexal mass. Other: Trace free fluid is noted in the cul-de-sac. Musculoskeletal: No worrisome lytic or sclerotic osseous abnormality. IMPRESSION: 1. Differential enhancement in the right kidney with segmental distribution. Imaging features suggest edema related to pyelonephritis. Some areas have a more rounded  configuration and raise the question of associated phlegmon, but no overt intrarenal abscess evident at this time. Potential small focus of involvement in the upper pole of the left kidney. 2. Otherwise unremarkable exam. Electronically Signed   By: Kennith Center M.D.   On: 02/25/2018 20:22   Dg Fluoro Guide Lumbar Puncture  Result Date: 02/26/2018 CLINICAL DATA:  Headache, fever, neck stiffness EXAM: DIAGNOSTIC LUMBAR PUNCTURE UNDER FLUOROSCOPIC GUIDANCE FLUOROSCOPY TIME:  Fluoroscopy Time:  12 seconds Radiation Exposure Index (if provided by the fluoroscopic device): 1.3 mGy Number of Acquired Spot Images: 0 PROCEDURE: Informed consent was obtained from the patient prior to the procedure, including potential complications of headache, allergy, and pain. With the patient prone, the lower back was prepped with Betadine. 1% Lidocaine was used for local anesthesia. Lumbar puncture was performed at the L3-4 level using a 20 gauge needle with return of clear CSF with an opening pressure of 23 cm water. 8-10 ml of CSF were obtained for laboratory studies. The patient tolerated the procedure well and there were no apparent complications. IMPRESSION: Technically successful lumbar puncture under fluoroscopic guidance as described above. Electronically Signed   By: Charlett Nose M.D.   On: 02/26/2018 12:50    Microbiology: Recent Results (from the past 240 hour(s))  Culture, blood (routine x 2)     Status: None (Preliminary result)   Collection Time: 02/25/18  2:30 PM  Result Value Ref Range Status   Specimen Description BLOOD LEFT ANTECUBITAL  Final   Special Requests   Final    BOTTLES DRAWN AEROBIC AND ANAEROBIC Performed at [facility], [address] [place], [place] [address]    Culture   Final    NO GROWTH 2 DAYS Performed at [facility], [address] [place], [place] [address]    Report Status PENDING  Incomplete  Culture, blood (routine x 2)     Status: None (Preliminary result)     Collection Time: 02/25/18  2:38 PM  Result Value Ref Range Status   Specimen Description   Final    BLOOD LEFT HAND Performed at [facility], [address], [place], [place] [address]    Special Requests   Final    BOTTLES DRAWN AEROBIC AND ANAEROBIC Performed at [facility], [address] [place], [place] [address]    Culture   Final    NO GROWTH 2 DAYS Performed at [facility], [address], [place], [place] [address]    Report Status PENDING  Incomplete  Urine culture     Status: Abnormal   Collection Time: 02/25/18 11:32 PM  Result Value Ref Range Status   Specimen Description   Final    URINE, CLEAN CATCH Performed at [facility], [address], [place], [place] [address]    Special Requests   Final    NONE Performed at [facility], [address] [place], [place] [address]    Culture MULTIPLE ORGANISMS PRESENT, NONE PREDOMINANT (A)  Final   Report Status 02/27/2018 FINAL  Final  CSF culture     Status: None (Preliminary result)   Collection Time: 02/26/18 12:38 PM  Result Value Ref Range Status   Specimen Description  Final    CSF Performed at [facility], [address] [place], [place] [address]    Special Requests   Final    NONE Performed at [facility], [address] [place], [place] [address]    Gram Stain   Final    WBC PRESENT, PREDOMINANTLY MONONUCLEAR NO ORGANISMS SEEN CYTOSPIN SMEAR    Culture   Final    NO GROWTH 2 DAYS Performed at [facility], [address] [place], [place] [address]    Report Status PENDING  Incomplete     Labs: Basic Metabolic Panel: Recent Labs  Lab 02/25/18 1525 02/26/18 0443 02/27/18 1143 02/28/18 0521  NA 137 138 140 140  K 2.9* 3.3* 3.0* 3.3*  CL 107 106 106 107  CO2 21* 25 25 23   GLUCOSE 107* 119* 97 101*  BUN 8 7 6 6   CREATININE 0.75 0.69 0.63 0.59  CALCIUM 7.4* 7.7* 8.3* 8.1*  MG  --    --   --  1.8   Liver Function Tests: Recent Labs  Lab 02/25/18 1525 02/26/18 0443  AST 13* 13*  ALT 10* 12*  ALKPHOS 70 69  BILITOT 0.8 0.8  PROT 6.9 6.2*  ALBUMIN 3.2* 3.0*   No results for input(s): LIPASE, AMYLASE in the last 168 hours. No results for input(s): AMMONIA in the last 168 hours. CBC: Recent Labs  Lab 02/25/18 1336 02/26/18 0443 02/27/18 1143 02/28/18 0521  WBC 22.4* 17.7* 12.5* 9.8  NEUTROABS 19.7*  --  9.6* 6.1  HGB 11.7* 9.8* 9.7* 9.0*  HCT 34.6* 30.1* 29.6* 26.7*  MCV 83.2 84.1 83.9 82.9  PLT 204 189 188 214   Cardiac Enzymes: No results for input(s): CKTOTAL, CKMB, CKMBINDEX, TROPONINI in the last 168 hours. BNP: BNP (last 3 results) No results for input(s): BNP in the last 8760 hours.  ProBNP (last 3 results) No results for input(s): PROBNP in the last 8760 hours.  CBG: No results for input(s): GLUCAP in the last 168 hours.     Signed:  Albertine Grates MD, PhD  [facility] 02/28/2018, 10:40 AM

## 2018-03-01 LAB — CSF CULTURE W GRAM STAIN

## 2018-03-01 LAB — CSF CULTURE: CULTURE: NO GROWTH

## 2018-03-02 LAB — CULTURE, BLOOD (ROUTINE X 2)
Culture: NO GROWTH
Culture: NO GROWTH

## 2018-03-13 NOTE — Progress Notes (Deleted)
Patient ID: Mandy Vasquez, female   DOB: 01/29/1991, 27 y.o.   MRN: [id number]    Hospitalization for sepsis 6/10-6/13/2019.   WUJ:[id number]:Mandy Scalesis a 27 y.o.femalewithhistory of anxiety presented with fever and right flank pain for the last few days associated with chills, increased frequency of urination but no hematuria along with nausea and very poor apetite.Patient denies any recent sick contact, recent travel, rash. Patient had fallen more than a week ago and landed on her back and started having back pain which was getting better with over-the-counter anti-inflammatory medication. But she started having worsening right flank pain for the last few days, sharp in nature, severe intensity up to 10/10 with radiation to left side and no aggravating or relieving factors. No loss of consciousness, seizures, chest pain, shortness of breath, palpitations.  ED Course:She was found to have leukocytosis with possible UTI. She was given IV fluids and IV antibiotics. Hospitalist service was called to evaluate the patient.    Hospital Course:  Active Problems:   Pyelonephritis   Sepsis (HCC)   Sepsis with right-sided pyelonephritis -Presented with fever,leukocytosis,tachycardia -ct ab/pel "Differential enhancement in the right kidney with segmental distribution. Imaging features suggest edema related to pyelonephritis. Some areas have a more rounded configuration and raise the question of associated phlegmon, but no overt intrarenal abscess evident at this time. Potential small focus of involvement in the upper pole of the left kidney. 2. Otherwise unremarkable exam." -ua+ bacteriuria,Urine culture multiple species,blood culture no growth -she received Rocephin iv, and ivf, symptom has resolved, wbc normalized. She is discharged on keflex for 10 more days to finish total of 14 days abx treatment, she is to follow up with pcp.   Hypokalemia -k3,from nausea vomiting -replaced, she  is discharged on k/mag supplement, pcp follow up to repeat labs  Headacheandneck pain, right sided weakness -CT abdomen no acute -Lumbar puncture with clear CSF WBC 1,culture no growth -supportive measures -symptom resolved  Normocytic anemia: -No overt sign of bleed -follow up with pmd  Social issues: [place],has no insurance no primary care doctor Case manager consulted, [place] [facility] info provided

## 2018-03-14 ENCOUNTER — Inpatient Hospital Stay: Payer: Self-pay

## 2018-07-23 ENCOUNTER — Inpatient Hospital Stay (HOSPITAL_COMMUNITY): Payer: Self-pay

## 2018-07-23 ENCOUNTER — Encounter (HOSPITAL_COMMUNITY): Payer: Self-pay | Admitting: *Deleted

## 2018-07-23 ENCOUNTER — Inpatient Hospital Stay (HOSPITAL_COMMUNITY)
Admission: AD | Admit: 2018-07-23 | Discharge: 2018-07-23 | Disposition: A | Discharge status: Home / Self Care | Payer: Self-pay | Source: Ambulatory Visit | Attending: Obstetrics and Gynecology | Admitting: Obstetrics and Gynecology

## 2018-07-23 DIAGNOSIS — Z3201 Encounter for pregnancy test, result positive: Secondary | ICD-10-CM | POA: Insufficient documentation

## 2018-07-23 DIAGNOSIS — Z3491 Encounter for supervision of normal pregnancy, unspecified, first trimester: Secondary | ICD-10-CM

## 2018-07-23 DIAGNOSIS — Z91040 Latex allergy status: Secondary | ICD-10-CM | POA: Insufficient documentation

## 2018-07-23 DIAGNOSIS — O208 Other hemorrhage in early pregnancy: Secondary | ICD-10-CM | POA: Insufficient documentation

## 2018-07-23 DIAGNOSIS — Z79899 Other long term (current) drug therapy: Secondary | ICD-10-CM | POA: Insufficient documentation

## 2018-07-23 DIAGNOSIS — O209 Hemorrhage in early pregnancy, unspecified: Secondary | ICD-10-CM

## 2018-07-23 DIAGNOSIS — Z91018 Allergy to other foods: Secondary | ICD-10-CM | POA: Insufficient documentation

## 2018-07-23 DIAGNOSIS — Z3A08 8 weeks gestation of pregnancy: Secondary | ICD-10-CM | POA: Insufficient documentation

## 2018-07-23 DIAGNOSIS — F419 Anxiety disorder, unspecified: Secondary | ICD-10-CM | POA: Insufficient documentation

## 2018-07-23 LAB — CBC
HEMATOCRIT: 37.2 % (ref 36.0–46.0)
Hemoglobin: 12.1 g/dL (ref 12.0–15.0)
MCH: 27.9 pg (ref 26.0–34.0)
MCHC: 32.5 g/dL (ref 30.0–36.0)
MCV: 85.7 fL (ref 80.0–100.0)
Platelets: 234 10*3/uL (ref 150–400)
RBC: 4.34 MIL/uL (ref 3.87–5.11)
RDW: 14.1 % (ref 11.5–15.5)
WBC: 9.1 10*3/uL (ref 4.0–10.5)
nRBC: 0 % (ref 0.0–0.2)

## 2018-07-23 LAB — WET PREP, GENITAL
Clue Cells Wet Prep HPF POC: NONE SEEN
SPERM: NONE SEEN
Trich, Wet Prep: NONE SEEN
YEAST WET PREP: NONE SEEN

## 2018-07-23 LAB — URINALYSIS, ROUTINE W REFLEX MICROSCOPIC
BILIRUBIN URINE: NEGATIVE
Glucose, UA: NEGATIVE mg/dL
Hgb urine dipstick: NEGATIVE
Ketones, ur: NEGATIVE mg/dL
Leukocytes, UA: NEGATIVE
Nitrite: NEGATIVE
Protein, ur: NEGATIVE mg/dL
SPECIFIC GRAVITY, URINE: 1.03 (ref 1.005–1.030)
pH: 5 (ref 5.0–8.0)

## 2018-07-23 LAB — HCG, QUANTITATIVE, PREGNANCY: hCG, Beta Chain, Quant, S: 32609 m[IU]/mL — ABNORMAL HIGH (ref <5)

## 2018-07-23 LAB — POCT PREGNANCY, URINE: PREG TEST UR: POSITIVE — AB

## 2018-07-23 NOTE — Discharge Instructions (Signed)
[facility]  ° ° °[facility]       Phone: [phone] ° °[facility] Phone: [phone] ° °[facility] [place]  Phone: [phone] ° °[facility] [place]  Phone: [phone] ° °[facility] [place]  Phone: [phone] ° °[facility]       Phone: [phone] ° °[facility]    Phone: [phone]  ° °[facility]    Phone: [phone] ° °[facility]    Phone: [phone] ° °[facility]    Phone: [phone] ° °[facility]    Phone: [phone] ° °[facility]       Phone: [phone]  ° °[facility]  Phone: [phone] ° °[facility]    Phone: [phone] ° °[facility]    Phone: [phone] ° °[facility]      Phone: [phone] ° °[facility]    Phone: [phone] ° °Safe Medications in Pregnancy  ° °Acne: °Benzoyl Peroxide °Salicylic Acid ° °Backache/Headache: °Tylenol: 2 regular strength every 4 hours OR °             2 Extra strength every 6 hours ° °Colds/Coughs/Allergies: °Benadryl (alcohol free) 25 mg every 6 hours as needed °Breath right strips °Claritin °Cepacol throat lozenges °Chloraseptic throat spray °Cold-Eeze- up to three times per day °Cough drops, alcohol free °Flonase (by prescription only) °Guaifenesin °Mucinex °Robitussin DM (plain only, alcohol free) °Saline nasal spray/drops °Sudafed (pseudoephedrine) & Actifed ** use only after [redacted] weeks gestation and if you do not have high blood pressure °Tylenol °Vicks Vaporub °Zinc lozenges °Zyrtec  ° °Constipation: °Colace °Ducolax suppositories °Fleet enema °Glycerin suppositories °Metamucil °Milk of  magnesia °Miralax °Senokot °Smooth move tea ° °Diarrhea: °Kaopectate °Imodium A-D ° °*NO pepto Bismol ° °Hemorrhoids: °Anusol °Anusol HC °Preparation H °Tucks ° °Indigestion: °Tums °Maalox °Mylanta °Zantac  °Pepcid ° °Insomnia: °Benadryl (alcohol free) 25mg every 6 hours as needed °Tylenol PM °Unisom, no Gelcaps ° °Leg Cramps: °Tums °MagGel ° °Nausea/Vomiting:  °Bonine °Dramamine °Emetrol °Ginger extract °Sea bands °Meclizine  °Nausea medication to take during pregnancy:  °Unisom (doxylamine succinate 25 mg tablets) Take one tablet daily at bedtime. If symptoms are not adequately controlled, the dose can be increased to a maximum recommended dose of two tablets daily (1/2 tablet in the morning, 1/2 tablet mid-afternoon and one at bedtime). °Vitamin B6 100mg tablets. Take one tablet twice a day (up to 200 mg per day). ° °Skin Rashes: °Aveeno products °Benadryl cream or 25mg every 6 hours as needed °Calamine Lotion °1% cortisone cream ° °Yeast infection: °Gyne-lotrimin 7 °Monistat 7 ° ° °**If taking multiple medications, please check labels to avoid duplicating the same active ingredients °**take medication as directed on the label °** Do not exceed 4000 mg of tylenol in 24 hours °**Do not take medications that contain aspirin or ibuprofen ° ° ° ° °First Trimester of Pregnancy °The first trimester of pregnancy is from week 1 until the end of week 13 (months 1 through 3). A week after a sperm fertilizes an egg, the egg will implant on the wall of the uterus. This embryo will begin to develop into a baby. Genes from you and your partner will form the baby. The female genes will determine whether the baby will be a boy or a girl. At 6-8   weeks, the eyes and face will be formed, and the heartbeat can be seen on ultrasound. At the end of 12 weeks, all the baby's organs will be formed. °Now that you are pregnant, you will want to do everything you can to have a healthy baby. Two of the most important things are to get good  prenatal care and to follow your health care provider's instructions. Prenatal care is all the medical care you receive before the baby's birth. This care will help prevent, find, and treat any problems during the pregnancy and childbirth. °Body changes during your first trimester °Your body goes through many changes during pregnancy. The changes vary from woman to woman. °· You may gain or lose a couple of pounds at first. °· You may feel sick to your stomach (nauseous) and you may throw up (vomit). If the vomiting is uncontrollable, call your health care provider. °· You may tire easily. °· You may develop headaches that can be relieved by medicines. All medicines should be approved by your health care provider. °· You may urinate more often. Painful urination may mean you have a bladder infection. °· You may develop heartburn as a result of your pregnancy. °· You may develop constipation because certain hormones are causing the muscles that push stool through your intestines to slow down. °· You may develop hemorrhoids or swollen veins (varicose veins). °· Your breasts may begin to grow larger and become tender. Your nipples may stick out more, and the tissue that surrounds them (areola) may become darker. °· Your gums may bleed and may be sensitive to brushing and flossing. °· Dark spots or blotches (chloasma, mask of pregnancy) may develop on your face. This will likely fade after the baby is born. °· Your menstrual periods will stop. °· You may have a loss of appetite. °· You may develop cravings for certain kinds of food. °· You may have changes in your emotions from day to day, such as being excited to be pregnant or being concerned that something may go wrong with the pregnancy and baby. °· You may have more vivid and strange dreams. °· You may have changes in your hair. These can include thickening of your hair, rapid growth, and changes in texture. Some women also have hair loss during or after pregnancy,  or hair that feels dry or thin. Your hair will most likely return to normal after your baby is born. ° °What to expect at prenatal visits °During a routine prenatal visit: °· You will be weighed to make sure you and the baby are growing normally. °· Your blood pressure will be taken. °· Your abdomen will be measured to track your baby's growth. °· The fetal heartbeat will be listened to between weeks 10 and 14 of your pregnancy. °· Test results from any previous visits will be discussed. ° °Your health care provider may ask you: °· How you are feeling. °· If you are feeling the baby move. °· If you have had any abnormal symptoms, such as leaking fluid, bleeding, severe headaches, or abdominal cramping. °· If you are using any tobacco products, including cigarettes, chewing tobacco, and electronic cigarettes. °· If you have any questions. ° °Other tests that may be performed during your first trimester include: °· Blood tests to find your blood type and to check for the presence of any previous infections. The tests will also be used to check for low iron levels (anemia) and protein on red blood cells (Rh antibodies). Depending   on your risk factors, or if you previously had diabetes during pregnancy, you may have tests to check for high blood sugar that affects pregnant women (gestational diabetes). °· Urine tests to check for infections, diabetes, or protein in the urine. °· An ultrasound to confirm the proper growth and development of the baby. °· Fetal screens for spinal cord problems (spina bifida) and Down syndrome. °· HIV (human immunodeficiency virus) testing. Routine prenatal testing includes screening for HIV, unless you choose not to have this test. °· You may need other tests to make sure you and the baby are doing well. ° °Follow these instructions at home: °Medicines °· Follow your health care provider's instructions regarding medicine use. Specific medicines may be either safe or unsafe to take during  pregnancy. °· Take a prenatal vitamin that contains at least 600 micrograms (mcg) of folic acid. °· If you develop constipation, try taking a stool softener if your health care provider approves. °Eating and drinking °· Eat a balanced diet that includes fresh fruits and vegetables, whole grains, good sources of protein such as meat, eggs, or tofu, and low-fat dairy. Your health care provider will help you determine the amount of weight gain that is right for you. °· Avoid raw meat and uncooked cheese. These carry germs that can cause birth defects in the baby. °· Eating four or five small meals rather than three large meals a day may help relieve nausea and vomiting. If you start to feel nauseous, eating a few soda crackers can be helpful. Drinking liquids between meals, instead of during meals, also seems to help ease nausea and vomiting. °· Limit foods that are high in fat and processed sugars, such as fried and sweet foods. °· To prevent constipation: °? Eat foods that are high in fiber, such as fresh fruits and vegetables, whole grains, and beans. °? Drink enough fluid to keep your urine clear or pale yellow. °Activity °· Exercise only as directed by your health care provider. Most women can continue their usual exercise routine during pregnancy. Try to exercise for 30 minutes at least 5 days a week. Exercising will help you: °? Control your weight. °? Stay in shape. °? Be prepared for labor and delivery. °· Experiencing pain or cramping in the lower abdomen or lower back is a good sign that you should stop exercising. Check with your health care provider before continuing with normal exercises. °· Try to avoid standing for long periods of time. Move your legs often if you must stand in one place for a long time. °· Avoid heavy lifting. °· Wear low-heeled shoes and practice good posture. °· You may continue to have sex unless your health care provider tells you not to. °Relieving pain and discomfort °· Wear a  good support bra to relieve breast tenderness. °· Take warm sitz baths to soothe any pain or discomfort caused by hemorrhoids. Use hemorrhoid cream if your health care provider approves. °· Rest with your legs elevated if you have leg cramps or low back pain. °· If you develop varicose veins in your legs, wear support hose. Elevate your feet for 15 minutes, 3-4 times a day. Limit salt in your diet. °Prenatal care °· Schedule your prenatal visits by the twelfth week of pregnancy. They are usually scheduled monthly at first, then more often in the last 2 months before delivery. °· Write down your questions. Take them to your prenatal visits. °· Keep all your prenatal visits as told by your health care   provider. This is important. °Safety °· Wear your seat belt at all times when driving. °· Make a list of emergency phone numbers, including numbers for family, friends, the hospital, and police and fire departments. °General instructions °· Ask your health care provider for a referral to a local prenatal education class. Begin classes no later than the beginning of month 6 of your pregnancy. °· Ask for help if you have counseling or nutritional needs during pregnancy. Your health care provider can offer advice or refer you to specialists for help with various needs. °· Do not use hot tubs, steam rooms, or saunas. °· Do not douche or use tampons or scented sanitary pads. °· Do not cross your legs for long periods of time. °· Avoid cat litter boxes and soil used by cats. These carry germs that can cause birth defects in the baby and possibly loss of the fetus by miscarriage or stillbirth. °· Avoid all smoking, herbs, alcohol, and medicines not prescribed by your health care provider. Chemicals in these products affect the formation and growth of the baby. °· Do not use any products that contain nicotine or tobacco, such as cigarettes and e-cigarettes. If you need help quitting, ask your health care provider. You may  receive counseling support and other resources to help you quit. °· Schedule a dentist appointment. At home, brush your teeth with a soft toothbrush and be gentle when you floss. °Contact a health care provider if: °· You have dizziness. °· You have mild pelvic cramps, pelvic pressure, or nagging pain in the abdominal area. °· You have persistent nausea, vomiting, or diarrhea. °· You have a bad smelling vaginal discharge. °· You have pain when you urinate. °· You notice increased swelling in your face, hands, legs, or ankles. °· You are exposed to fifth disease or chickenpox. °· You are exposed to German measles (rubella) and have never had it. °Get help right away if: °· You have a fever. °· You are leaking fluid from your vagina. °· You have spotting or bleeding from your vagina. °· You have severe abdominal cramping or pain. °· You have rapid weight gain or loss. °· You vomit blood or material that looks like coffee grounds. °· You develop a severe headache. °· You have shortness of breath. °· You have any kind of trauma, such as from a fall or a car accident. °Summary °· The first trimester of pregnancy is from week 1 until the end of week 13 (months 1 through 3). °· Your body goes through many changes during pregnancy. The changes vary from woman to woman. °· You will have routine prenatal visits. During those visits, your health care provider will examine you, discuss any test results you may have, and talk with you about how you are feeling. °This information is not intended to replace advice given to you by your health care provider. Make sure you discuss any questions you have with your health care provider. °Document Released: 08/29/2001 Document Revised: 08/16/2016 Document Reviewed: 08/16/2016 °Elsevier Interactive Patient Education © 2018 [employer] ° °

## 2018-07-23 NOTE — MAU Provider Note (Signed)
History     CSN: [id number]  Arrival date and time: 07/23/18 1108   First Provider Initiated Contact with Patient 07/23/18 1152      Chief Complaint  Patient presents with  . Possible Pregnancy   HPI Mandy Vasquez is a 27 y.o. [id number] at [redacted]w[redacted]d who presents with vaginal bleeding. She states she had a positive pregnancy test 2 weeks ago and then had bleeding like a period this week. She denies any pain. LMP 05/25/18. She denies any abnormal discharge and denies recent intercourse. She states the bleeding has since stopped and she wants to know if she is still pregnant  OB History    Gravida  6   Para  3   Term  1   Preterm  1   AB  1   Living  3     SAB  1   TAB      Ectopic      Multiple      Live Births  2           Past Medical History:  Diagnosis Date  . Anxiety     Past Surgical History:  Procedure Laterality Date  . NO PAST SURGERIES      Family History  Problem Relation Age of Onset  . Diabetes Maternal Aunt   . Heart disease Maternal Aunt   . Diabetes Maternal Grandmother   . Heart disease Maternal Grandmother     Social History   Tobacco Use  . Smoking status: Never Smoker  . Smokeless tobacco: Never Used  Substance Use Topics  . Alcohol use: No  . Drug use: No    Allergies:  Allergies  Allergen Reactions  . Latex Swelling  . Onion Swelling    Medications Prior to Admission  Medication Sig Dispense Refill Last Dose  . Ibuprofen-diphenhydrAMINE HCl (ADVIL PM) 200-25 MG CAPS Take 1 tablet by mouth as needed.   02/25/2018 at Unknown time  . magnesium gluconate (MAGONATE) 30 MG tablet Take 1 tablet (30 mg total) by mouth daily. 30 tablet 0   . methocarbamol (ROBAXIN) 500 MG tablet Take 1 tablet (500 mg total) by mouth every 8 (eight) hours as needed for muscle spasms. 10 tablet 0   . phenazopyridine (PYRIDIUM) 95 MG tablet Take 95 mg by mouth 3 (three) times daily as needed for pain.   02/24/2018 at Unknown time  . potassium  chloride (K-DUR) 10 MEQ tablet Take 1 tablet (10 mEq total) by mouth daily. 30 tablet 0     Review of Systems  Constitutional: Negative.  Negative for fatigue and fever.  HENT: Negative.   Respiratory: Negative.  Negative for shortness of breath.   Cardiovascular: Negative.  Negative for chest pain.  Gastrointestinal: Negative.  Negative for abdominal pain, constipation, diarrhea, nausea and vomiting.  Genitourinary: Positive for vaginal bleeding. Negative for dysuria and vaginal discharge.  Neurological: Negative.  Negative for dizziness and headaches.   Physical Exam   Blood pressure 111/69, pulse (!) 59, temperature 98.1 F (36.7 C), temperature source Oral, resp. rate 16, height 5\' 10"  (1.778 m), weight 54 kg, last menstrual period 05/25/2018, SpO2 100 %, unknown if currently breastfeeding.  Physical Exam  Nursing note and vitals reviewed. Constitutional: She is oriented to person, place, and time. She appears well-developed and well-nourished. No distress.  HENT:  Head: Normocephalic.  Eyes: Pupils are equal, round, and reactive to light.  Cardiovascular: Normal rate, regular rhythm and normal heart sounds.  Respiratory: Effort normal and  breath sounds normal. No respiratory distress.  GI: Soft. Bowel sounds are normal. She exhibits no distension. There is no tenderness.  Neurological: She is alert and oriented to person, place, and time.  Skin: Skin is warm and dry.  Psychiatric: She has a normal mood and affect. Her behavior is normal. Judgment and thought content normal.   MAU Course  Procedures Results for orders placed or performed during the hospital encounter of 07/23/18 (from the past 24 hour(s))  Pregnancy, urine POC     Status: Abnormal   Collection Time: 07/23/18 11:35 AM  Result Value Ref Range   Preg Test, Ur POSITIVE (A) NEGATIVE  CBC     Status: None   Collection Time: 07/23/18 11:44 AM  Result Value Ref Range   WBC 9.1 4.0 - 10.5 K/uL   RBC 4.34 3.87 -  5.11 MIL/uL   Hemoglobin 12.1 12.0 - 15.0 g/dL   HCT 16.1 09.6 - 04.5 %   MCV 85.7 80.0 - 100.0 fL   MCH 27.9 26.0 - 34.0 pg   MCHC 32.5 30.0 - 36.0 g/dL   RDW 40.9 81.1 - 91.4 %   Platelets 234 150 - 400 K/uL   nRBC 0.0 0.0 - 0.2 %  hCG, quantitative, pregnancy     Status: Abnormal   Collection Time: 07/23/18 11:44 AM  Result Value Ref Range   hCG, Beta Chain, Quant, S 32,609 (H) <5 mIU/mL  Wet prep, genital     Status: Abnormal   Collection Time: 07/23/18 12:05 PM  Result Value Ref Range   Yeast Wet Prep HPF POC NONE SEEN NONE SEEN   Trich, Wet Prep NONE SEEN NONE SEEN   Clue Cells Wet Prep HPF POC NONE SEEN NONE SEEN   WBC, Wet Prep HPF POC FEW (A) NONE SEEN   Sperm NONE SEEN    US Ob Less Than 14 Weeks With Ob Transvaginal  Result Date: 07/23/2018 CLINICAL DATA:  Pregnant patient with vaginal bleeding. EXAM: OBSTETRIC <14 WK [place] AND TRANSVAGINAL OB US TECHNIQUE: Both transabdominal and transvaginal ultrasound examinations were performed for complete evaluation of the gestation as well as the maternal uterus, adnexal regions, and pelvic cul-de-sac. Transvaginal technique was performed to assess early pregnancy. COMPARISON:  CT abdomen pelvis 02/25/2018 FINDINGS: Intrauterine gestational sac: Single Yolk sac:  Visualized. Embryo:  Visualized. Cardiac Activity: Visualized. Heart Rate: 166 bpm CRL:  15.2 mm   7 w   6 d                  [place] EDC: 03/05/2019 Subchorionic hemorrhage:  None visualized. Maternal uterus/adnexae: Normal right and left ovary. No free fluid in the pelvis. IMPRESSION: Single live intrauterine gestation.  No subchorionic hemorrhage. Electronically Signed   By: Annia Belt M.D.   On: 07/23/2018 12:49    MDM UA, UPT CBC, HCG ABO/Rh- B Pos Wet prep and gc/chlamydia US OB Comp Less 14 weeks with Transvaginal   Assessment and Plan   1. Normal intrauterine pregnancy on prenatal ultrasound in first trimester   2. Vaginal bleeding affecting early pregnancy   3. [redacted]  weeks gestation of pregnancy    -Discharge home in stable condition -First trimester precautions discussed -Patient advised to follow-up with OB of choice to start prenatal care -Patient may return to MAU as needed or if her condition were to change or worsen  Rolm Bookbinder CNM 07/23/2018, 12:58 PM

## 2018-07-23 NOTE — MAU Note (Signed)
Pt reports positive home preg test 2 weeks ago, started bleeding right after positive test. Bleeding started as spotting, but was like a period off/on. Bleeding stopped yesterday. Also had pain when bleeding started but that stopped also. States she just wants to make sure if she has miscarried that everything has passed

## 2018-07-24 LAB — GC/CHLAMYDIA PROBE AMP (~~LOC~~) NOT AT ARMC
CHLAMYDIA, DNA PROBE: NEGATIVE
Neisseria Gonorrhea: NEGATIVE

## 2018-08-20 ENCOUNTER — Encounter (HOSPITAL_COMMUNITY): Payer: Self-pay

## 2018-08-20 ENCOUNTER — Other Ambulatory Visit: Payer: Self-pay

## 2018-08-20 ENCOUNTER — Inpatient Hospital Stay (HOSPITAL_COMMUNITY)
Admission: AD | Admit: 2018-08-20 | Discharge: 2018-08-20 | Disposition: A | Discharge status: Home / Self Care | Payer: Medicaid Other | Source: Ambulatory Visit | Attending: Obstetrics and Gynecology | Admitting: Obstetrics and Gynecology

## 2018-08-20 ENCOUNTER — Inpatient Hospital Stay (HOSPITAL_COMMUNITY): Payer: Medicaid Other

## 2018-08-20 DIAGNOSIS — O468X1 Other antepartum hemorrhage, first trimester: Secondary | ICD-10-CM | POA: Diagnosis not present

## 2018-08-20 DIAGNOSIS — O468X9 Other antepartum hemorrhage, unspecified trimester: Secondary | ICD-10-CM

## 2018-08-20 DIAGNOSIS — O418X10 Other specified disorders of amniotic fluid and membranes, first trimester, not applicable or unspecified: Secondary | ICD-10-CM

## 2018-08-20 DIAGNOSIS — Z3A12 12 weeks gestation of pregnancy: Secondary | ICD-10-CM | POA: Diagnosis not present

## 2018-08-20 DIAGNOSIS — O200 Threatened abortion: Secondary | ICD-10-CM | POA: Diagnosis not present

## 2018-08-20 DIAGNOSIS — O208 Other hemorrhage in early pregnancy: Secondary | ICD-10-CM | POA: Diagnosis present

## 2018-08-20 DIAGNOSIS — O418X90 Other specified disorders of amniotic fluid and membranes, unspecified trimester, not applicable or unspecified: Secondary | ICD-10-CM | POA: Diagnosis present

## 2018-08-20 DIAGNOSIS — O209 Hemorrhage in early pregnancy, unspecified: Secondary | ICD-10-CM

## 2018-08-20 LAB — URINALYSIS, ROUTINE W REFLEX MICROSCOPIC
Bilirubin Urine: NEGATIVE
Glucose, UA: NEGATIVE mg/dL
Ketones, ur: NEGATIVE mg/dL
Nitrite: NEGATIVE
PROTEIN: NEGATIVE mg/dL
Specific Gravity, Urine: 1.026 (ref 1.005–1.030)
pH: 6 (ref 5.0–8.0)

## 2018-08-20 LAB — CBC
HCT: 34.4 % — ABNORMAL LOW (ref 36.0–46.0)
HEMOGLOBIN: 11.4 g/dL — AB (ref 12.0–15.0)
MCH: 28.5 pg (ref 26.0–34.0)
MCHC: 33.1 g/dL (ref 30.0–36.0)
MCV: 86 fL (ref 80.0–100.0)
Platelets: 232 10*3/uL (ref 150–400)
RBC: 4 MIL/uL (ref 3.87–5.11)
RDW: 14.3 % (ref 11.5–15.5)
WBC: 8.3 10*3/uL (ref 4.0–10.5)
nRBC: 0 % (ref 0.0–0.2)

## 2018-08-20 NOTE — Discharge Instructions (Signed)

## 2018-08-20 NOTE — MAU Provider Note (Signed)
History     CSN: [id number]  Arrival date and time: 08/20/18 1637   First Provider Initiated Contact with Patient 08/20/18 1920      Chief Complaint  Patient presents with  . Vaginal Bleeding  . Abdominal Pain   HPI  Ms.  Mandy Vasquez is a 27 y.o. year old G6P2123 female at [redacted]w[redacted]d weeks gestation who presents to MAU reporting she woke up with a lot of blood since 12 pm. She reports the VB is more than before. She also complains of lower abdominal that started . She describes the pain as tightening/tensing. Her last SI was 2 days ago.  Past Medical History:  Diagnosis Date  . Anxiety     Past Surgical History:  Procedure Laterality Date  . NO PAST SURGERIES      Family History  Problem Relation Age of Onset  . Diabetes Maternal Aunt   . Heart disease Maternal Aunt   . Diabetes Maternal Grandmother   . Heart disease Maternal Grandmother     Social History   Tobacco Use  . Smoking status: Never Smoker  . Smokeless tobacco: Never Used  Substance Use Topics  . Alcohol use: No  . Drug use: No    Allergies:  Allergies  Allergen Reactions  . Latex Swelling  . Onion Swelling    Medications Prior to Admission  Medication Sig Dispense Refill Last Dose  . Prenatal Vit-Fe Fumarate-FA (PRENATAL MULTIVITAMIN) TABS tablet Take 1 tablet by mouth daily at 12 noon.       Review of Systems  Constitutional: Negative.   HENT: Negative.   Eyes: Negative.   Respiratory: Negative.   Cardiovascular: Negative.   Gastrointestinal: Negative.   Endocrine: Negative.   Genitourinary: Positive for pelvic pain (cramping) and vaginal bleeding.  Musculoskeletal: Negative.   Skin: Negative.   Allergic/Immunologic: Negative.   Neurological: Negative.   Hematological: Negative.   Psychiatric/Behavioral: Negative.    Physical Exam   Blood pressure 102/61, pulse 86, temperature 98.2 F (36.8 C), temperature source Oral, resp. rate 16, weight 53.6 kg, last menstrual period  05/25/2018, SpO2 100 %.  Physical Exam  Nursing note and vitals reviewed. Constitutional: She is oriented to person, place, and time. She appears well-developed and well-nourished.  HENT:  Head: Normocephalic and atraumatic.  Eyes: Pupils are equal, round, and reactive to light.  Neck: Normal range of motion.  Cardiovascular: Normal rate.  Respiratory: Effort normal.  GI: Soft.  Genitourinary:  Genitourinary Comments: Uterus: enlarged, S=D, SE: cervix is smooth, pink, no lesions, small amt of thin, dark, red bloody vaginal d/c; VE: closed/long/firm, no CMT or friability, no adnexal tenderness   Musculoskeletal: Normal range of motion.  Neurological: She is alert and oriented to person, place, and time.  Skin: Skin is warm and dry.  Psychiatric: She has a normal mood and affect. Her behavior is normal. Judgment and thought content normal.    MAU Course  Procedures  MDM CCUA UPT CBC OB < 14 wks F/U US with TV  Results for orders placed or performed during the hospital encounter of 08/20/18 (from the past 24 hour(s))  Urinalysis, Routine w reflex microscopic     Status: Abnormal   Collection Time: 08/20/18  5:24 PM  Result Value Ref Range   Color, Urine YELLOW YELLOW   APPearance CLEAR CLEAR   Specific Gravity, Urine 1.026 1.005 - 1.030   pH 6.0 5.0 - 8.0   Glucose, UA NEGATIVE NEGATIVE mg/dL   Hgb urine dipstick LARGE (A)  NEGATIVE   Bilirubin Urine NEGATIVE NEGATIVE   Ketones, ur NEGATIVE NEGATIVE mg/dL   Protein, ur NEGATIVE NEGATIVE mg/dL   Nitrite NEGATIVE NEGATIVE   Leukocytes, UA TRACE (A) NEGATIVE   RBC / HPF 6-10 0 - 5 RBC/hpf   WBC, UA 11-20 0 - 5 WBC/hpf   Bacteria, UA RARE (A) NONE SEEN   Squamous Epithelial / LPF 0-5 0 - 5   Mucus PRESENT   CBC     Status: Abnormal   Collection Time: 08/20/18  8:09 PM  Result Value Ref Range   WBC 8.3 4.0 - 10.5 K/uL   RBC 4.00 3.87 - 5.11 MIL/uL   Hemoglobin 11.4 (L) 12.0 - 15.0 g/dL   HCT 16.134.4 (L) 09.636.0 - 04.546.0 %   MCV  86.0 80.0 - 100.0 fL   MCH 28.5 26.0 - 34.0 pg   MCHC 33.1 30.0 - 36.0 g/dL   RDW 40.914.3 81.111.5 - 91.415.5 %   Platelets 232 150 - 400 K/uL   nRBC 0.0 0.0 - 0.2 %    [place] Ob Comp Less 14 Wks  Result Date: 08/20/2018 CLINICAL DATA:  27 year old pregnant female with vaginal bleeding and cramping. LMP: 05/25/2018 corresponding to an estimated gestational age of [redacted] weeks, 3 days. The estimated gestational age by first ultrasound is 11 weeks, 6 days. EXAM: OBSTETRIC <14 WK ULTRASOUND TECHNIQUE: Transabdominal ultrasound was performed for evaluation of the gestation as well as the maternal uterus and adnexal regions. COMPARISON:  Ultrasound dated 07/23/2018 FINDINGS: Intrauterine gestational sac: Single intrauterine gestational sac. Yolk sac:  Not seen Embryo:  Present Cardiac Activity: Detected Heart Rate: 168 bpm CRL:   50 mm   11 w 5 d                  US EDC: 03/06/2019 Subchorionic hemorrhage: Small subchorionic hemorrhage measuring 3 x 20 mm. Maternal uterus/adnexae: The ovaries are unremarkable. IMPRESSION: 1. Single live intrauterine pregnancy with an estimated gestational age of [redacted] weeks, 5 days based on today's ultrasound and 11 weeks, 6 days based on first ultrasound. 2. Small subchorionic hemorrhage. Electronically Signed   By: Elgie CollardArash  Radparvar M.D.   On: 08/20/2018 22:03     Assessment and Plan  Threatened miscarriage - Information provided on threatened miscarriage   Bleeding in early pregnancy - Reviewed bldg precautions  Subchorionic hematoma in first trimester, single or unspecified fetus  - [facility] and pelvic rest  - Discharge patient - [facility] - Encouraged to return here or to other Urgent Care/ED if she develops worsening of symptoms, increase in pain, fever, or other concerning symptoms.  - Patient verbalized an understanding of the plan of care and agrees.     Mandy Moraolitta Amisadai Woodford, MSN, CNM 08/20/2018, 7:21 PM

## 2018-08-20 NOTE — MAU Note (Signed)
Supposed to be 11 wks preg.  Woke up with a lot of blood, more this time than before .  Pain in lower stomach, tightening and tensing up.  Has been light headed and dizzy.

## 2018-09-16 ENCOUNTER — Encounter: Payer: Self-pay | Admitting: Advanced Practice Midwife

## 2018-09-16 NOTE — Progress Notes (Deleted)
Subjective:   Mandy Vasquez is a 27 y.o. [id number] at 5171w2d by {Ob dating:14516} being seen today for her first obstetrical visit.  Her obstetrical history is significant for {ob risk factors:10154} and has Pyelonephritis; Sepsis (HCC); Subchorionic hematoma; and Threatened miscarriage on their problem list.. Patient {does/does not:19097} intend to breast feed. Pregnancy history fully reviewed.  Patient reports {sx:14538}.  HISTORY: OB History  Gravida Para Term Preterm AB Living  6 3 2 1 2 3   SAB TAB Ectopic Multiple Live Births  2 0 0 0 3    # Outcome Date GA Lbr Len/2nd Weight Sex Delivery Anes PTL Lv  6 Current           5 Term 2018     Vag-Spont   LIV  4 Preterm 03/26/16 4780w0d    Vag-Spont   LIV  3 SAB 2015 5967w0d         2 SAB 2015          1 Term 04/30/13 7454w0d   F Vag-Spont   LIV   Past Medical History:  Diagnosis Date  . Anxiety    Past Surgical History:  Procedure Laterality Date  . NO PAST SURGERIES     Family History  Problem Relation Age of Onset  . Diabetes Maternal Aunt   . Heart disease Maternal Aunt   . Diabetes Maternal Grandmother   . Heart disease Maternal Grandmother    Social History   Tobacco Use  . Smoking status: Never Smoker  . Smokeless tobacco: Never Used  Substance Use Topics  . Alcohol use: No  . Drug use: No   Allergies  Allergen Reactions  . Latex Swelling  . Onion Swelling   Current Outpatient Medications on File Prior to Visit  Medication Sig Dispense Refill  . Prenatal Vit-Fe Fumarate-FA (PRENATAL MULTIVITAMIN) TABS tablet Take 1 tablet by mouth daily at 12 noon.     No current facility-administered medications on file prior to visit.      Exam   There were no vitals filed for this visit.    Uterus:     Pelvic Exam: Perineum: no hemorrhoids, normal perineum   Vulva: normal external genitalia, no lesions   Vagina:  normal mucosa, normal discharge   Cervix: no lesions and normal, pap smear done.    Adnexa:  normal adnexa and no mass, fullness, tenderness   Bony Pelvis: average  System: General: well-developed, well-nourished female in no acute distress   Breast:  normal appearance, no masses or tenderness   Skin: normal coloration and turgor, no rashes   Neurologic: oriented, normal, negative, normal mood   Extremities: normal strength, tone, and muscle mass, ROM of all joints is normal   HEENT PERRLA, extraocular movement intact and sclera clear, anicteric   Mouth/Teeth mucous membranes moist, pharynx normal without lesions and dental hygiene good   Neck supple and no masses   Cardiovascular: regular rate and rhythm   Respiratory:  no respiratory distress, normal breath sounds   Abdomen: soft, non-tender; bowel sounds normal; no masses,  no organomegaly     Assessment:   Pregnancy: [id number] Patient Active Problem List   Diagnosis Date Noted  . Subchorionic hematoma 08/20/2018  . Threatened miscarriage 08/20/2018  . Pyelonephritis 02/25/2018  . Sepsis (HCC) 02/25/2018     Plan:  1. Supervision of high risk pregnancy, antepartum ***  2. History of preterm delivery, currently pregnant ***    Initial labs drawn. Continue prenatal vitamins. Discussed and  offered genetic screening options, including Quad screen/AFP, NIPS testing, and option to decline testing. Benefits/risks/alternatives reviewed. Pt aware that anatomy US is form of genetic screening with lower accuracy in detecting trisomies than blood work.  Pt chooses/declines genetic screening today. {Blank multiple:19196::"First trimester screen","Quad screen","NIPS"}: {requests/ordered/declines:14581}. Ultrasound discussed; fetal anatomic survey: {requests/ordered/declines:14581}. Problem list reviewed and updated. The nature of  [facility] with multiple MDs and other Advanced Practice Providers was explained to patient; also emphasized that residents, students are part of our team. Routine  obstetric precautions reviewed. No follow-ups on file.   Sharen CounterLisa Leftwich-Kirby, CNM 09/16/18 8:27 AM

## 2018-09-18 NOTE — L&D Delivery Note (Signed)
LABOR COURSE Patient was a scheduled IOL for FGR. She received buccal Cytotec x 1 and and had a foley bulb placed around 10am today. She progressed without augmentation. She ruptured spontaneously with delivery of fetal head.  Delivery Note Called to room and patient was complete and pushing. Head delivered LOT. No nuchal cord present. Shoulder and body delivered in usual fashion. At  1555 a viable female was delivered via Vaginal, Spontaneous (Presentation:LOT; LOA).  Infant with spontaneous cry, placed on mother's abdomen, dried and stimulated. Cord clamped x 2 after two-minute delay, and cut by FOB. Cord blood drawn. Placenta delivered spontaneously with gentle cord traction. Appears intact. Fundus firm with massage and Pitocin. Labia, perineum, vagina, and cervix inspected with no lacerations noted.    APGAR:9, 9; weight: 2356g.   Cord: 3VC with the following complications:none.   Cord pH: not collected  Anesthesia:  epidural Episiotomy: None Lacerations: None Est. Blood Loss (mL): 50  Mom to postpartum.  Baby to Couplet care / Skin to Skin.  Patient declined COVID testing. She is aware of possible impact on postpartum couplet care.  Mallie Snooks, CNM 02/22/19  4:58 PM

## 2018-09-25 ENCOUNTER — Encounter: Payer: Self-pay | Admitting: Obstetrics and Gynecology

## 2018-10-08 ENCOUNTER — Encounter: Payer: Self-pay | Admitting: Obstetrics and Gynecology

## 2018-10-08 ENCOUNTER — Ambulatory Visit (INDEPENDENT_AMBULATORY_CARE_PROVIDER_SITE_OTHER): Payer: Medicaid Other | Admitting: Obstetrics and Gynecology

## 2018-10-08 ENCOUNTER — Other Ambulatory Visit (HOSPITAL_COMMUNITY)
Admission: RE | Admit: 2018-10-08 | Discharge: 2018-10-08 | Disposition: A | Discharge status: Home / Self Care | Payer: Medicaid Other | Source: Ambulatory Visit | Attending: Obstetrics and Gynecology | Admitting: Obstetrics and Gynecology

## 2018-10-08 DIAGNOSIS — O0930 Supervision of pregnancy with insufficient antenatal care, unspecified trimester: Secondary | ICD-10-CM | POA: Insufficient documentation

## 2018-10-08 DIAGNOSIS — O0990 Supervision of high risk pregnancy, unspecified, unspecified trimester: Secondary | ICD-10-CM

## 2018-10-08 DIAGNOSIS — O09899 Supervision of other high risk pregnancies, unspecified trimester: Secondary | ICD-10-CM

## 2018-10-08 DIAGNOSIS — O09212 Supervision of pregnancy with history of pre-term labor, second trimester: Secondary | ICD-10-CM

## 2018-10-08 DIAGNOSIS — O98812 Other maternal infectious and parasitic diseases complicating pregnancy, second trimester: Secondary | ICD-10-CM

## 2018-10-08 DIAGNOSIS — O0932 Supervision of pregnancy with insufficient antenatal care, second trimester: Secondary | ICD-10-CM

## 2018-10-08 DIAGNOSIS — A749 Chlamydial infection, unspecified: Secondary | ICD-10-CM

## 2018-10-08 DIAGNOSIS — Z3481 Encounter for supervision of other normal pregnancy, first trimester: Secondary | ICD-10-CM | POA: Diagnosis not present

## 2018-10-08 DIAGNOSIS — O09219 Supervision of pregnancy with history of pre-term labor, unspecified trimester: Secondary | ICD-10-CM

## 2018-10-08 DIAGNOSIS — O0992 Supervision of high risk pregnancy, unspecified, second trimester: Secondary | ICD-10-CM

## 2018-10-08 NOTE — Patient Instructions (Signed)
° °Second Trimester of Pregnancy °The second trimester is from week 14 through week 27 (months 4 through 6). The second trimester is often a time when you feel your best. Your body has adjusted to being pregnant, and you begin to feel better physically. Usually, morning sickness has lessened or quit completely, you may have more energy, and you may have an increase in appetite. The second trimester is also a time when the fetus is growing rapidly. At the end of the sixth month, the fetus is about 9 inches long and weighs about 1½ pounds. You will likely begin to feel the baby move (quickening) between 16 and 20 weeks of pregnancy. °Body changes during your second trimester °Your body continues to go through many changes during your second trimester. The changes vary from woman to woman. °· Your weight will continue to increase. You will notice your lower abdomen bulging out. °· You may begin to get stretch marks on your hips, abdomen, and breasts. °· You may develop headaches that can be relieved by medicines. The medicines should be approved by your health care provider. °· You may urinate more often because the fetus is pressing on your bladder. °· You may develop or continue to have heartburn as a result of your pregnancy. °· You may develop constipation because certain hormones are causing the muscles that push waste through your intestines to slow down. °· You may develop hemorrhoids or swollen, bulging veins (varicose veins). °· You may have back pain. This is caused by: °? Weight gain. °? Pregnancy hormones that are relaxing the joints in your pelvis. °? A shift in weight and the muscles that support your balance. °· Your breasts will continue to grow and they will continue to become tender. °· Your gums may bleed and may be sensitive to brushing and flossing. °· Dark spots or blotches (chloasma, mask of pregnancy) may develop on your face. This will likely fade after the baby is born. °· A dark line from  your belly button to the pubic area (linea nigra) may appear. This will likely fade after the baby is born. °· You may have changes in your hair. These can include thickening of your hair, rapid growth, and changes in texture. Some women also have hair loss during or after pregnancy, or hair that feels dry or thin. Your hair will most likely return to normal after your baby is born. °What to expect at prenatal visits °During a routine prenatal visit: °· You will be weighed to make sure you and the fetus are growing normally. °· Your blood pressure will be taken. °· Your abdomen will be measured to track your baby's growth. °· The fetal heartbeat will be listened to. °· Any test results from the previous visit will be discussed. °Your health care provider may ask you: °· How you are feeling. °· If you are feeling the baby move. °· If you have had any abnormal symptoms, such as leaking fluid, bleeding, severe headaches, or abdominal cramping. °· If you are using any tobacco products, including cigarettes, chewing tobacco, and electronic cigarettes. °· If you have any questions. °Other tests that may be performed during your second trimester include: °· Blood tests that check for: °? Low iron levels (anemia). °? High blood sugar that affects pregnant women (gestational diabetes) between 24 and 28 weeks. °? Rh antibodies. This is to check for a protein on red blood cells (Rh factor). °· Urine tests to check for infections, diabetes, or protein in   the urine. °· An ultrasound to confirm the proper growth and development of the baby. °· An amniocentesis to check for possible genetic problems. °· Fetal screens for spina bifida and Down syndrome. °· HIV (human immunodeficiency virus) testing. Routine prenatal testing includes screening for HIV, unless you choose not to have this test. °Follow these instructions at home: °Medicines °· Follow your health care provider's instructions regarding medicine use. Specific medicines  may be either safe or unsafe to take during pregnancy. °· Take a prenatal vitamin that contains at least 600 micrograms (mcg) of folic acid. °· If you develop constipation, try taking a stool softener if your health care provider approves. °Eating and drinking ° °· Eat a balanced diet that includes fresh fruits and vegetables, whole grains, good sources of protein such as meat, eggs, or tofu, and low-fat dairy. Your health care provider will help you determine the amount of weight gain that is right for you. °· Avoid raw meat and uncooked cheese. These carry germs that can cause birth defects in the baby. °· If you have low calcium intake from food, talk to your health care provider about whether you should take a daily calcium supplement. °· Limit foods that are high in fat and processed sugars, such as fried and sweet foods. °· To prevent constipation: °? Drink enough fluid to keep your urine clear or pale yellow. °? Eat foods that are high in fiber, such as fresh fruits and vegetables, whole grains, and beans. °Activity °· Exercise only as directed by your health care provider. Most women can continue their usual exercise routine during pregnancy. Try to exercise for 30 minutes at least 5 days a week. Stop exercising if you experience uterine contractions. °· Avoid heavy lifting, wear low heel shoes, and practice good posture. °· A sexual relationship may be continued unless your health care provider directs you otherwise. °Relieving pain and discomfort °· Wear a good support bra to prevent discomfort from breast tenderness. °· Take warm sitz baths to soothe any pain or discomfort caused by hemorrhoids. Use hemorrhoid cream if your health care provider approves. °· Rest with your legs elevated if you have leg cramps or low back pain. °· If you develop varicose veins, wear support hose. Elevate your feet for 15 minutes, 3-4 times a day. Limit salt in your diet. °Prenatal Care °· Write down your questions. Take  them to your prenatal visits. °· Keep all your prenatal visits as told by your health care provider. This is important. °Safety °· Wear your seat belt at all times when driving. °· Make a list of emergency phone numbers, including numbers for family, friends, the hospital, and police and fire departments. °General instructions °· Ask your health care provider for a referral to a local prenatal education class. Begin classes no later than the beginning of month 6 of your pregnancy. °· Ask for help if you have counseling or nutritional needs during pregnancy. Your health care provider can offer advice or refer you to specialists for help with various needs. °· Do not use hot tubs, steam rooms, or saunas. °· Do not douche or use tampons or scented sanitary pads. °· Do not cross your legs for long periods of time. °· Avoid cat litter boxes and soil used by cats. These carry germs that can cause birth defects in the baby and possibly loss of the fetus by miscarriage or stillbirth. °· Avoid all smoking, herbs, alcohol, and unprescribed drugs. Chemicals in these products can affect the   formation and growth of the baby. °· Do not use any products that contain nicotine or tobacco, such as cigarettes and e-cigarettes. If you need help quitting, ask your health care provider. °· Visit your dentist if you have not gone yet during your pregnancy. Use a soft toothbrush to brush your teeth and be gentle when you floss. °Contact a health care provider if: °· You have dizziness. °· You have mild pelvic cramps, pelvic pressure, or nagging pain in the abdominal area. °· You have persistent nausea, vomiting, or diarrhea. °· You have a bad smelling vaginal discharge. °· You have pain when you urinate. °Get help right away if: °· You have a fever. °· You are leaking fluid from your vagina. °· You have spotting or bleeding from your vagina. °· You have severe abdominal cramping or pain. °· You have rapid weight gain or weight loss. °· You  have shortness of breath with chest pain. °· You notice sudden or extreme swelling of your face, hands, ankles, feet, or legs. °· You have not felt your baby move in over an hour. °· You have severe headaches that do not go away when you take medicine. °· You have vision changes. °Summary °· The second trimester is from week 14 through week 27 (months 4 through 6). It is also a time when the fetus is growing rapidly. °· Your body goes through many changes during pregnancy. The changes vary from woman to woman. °· Avoid all smoking, herbs, alcohol, and unprescribed drugs. These chemicals affect the formation and growth your baby. °· Do not use any tobacco products, such as cigarettes, chewing tobacco, and e-cigarettes. If you need help quitting, ask your health care provider. °· Contact your health care provider if you have any questions. Keep all prenatal visits as told by your health care provider. This is important. °This information is not intended to replace advice given to you by your health care provider. Make sure you discuss any questions you have with your health care provider. °Document Released: 08/29/2001 Document Revised: 10/10/2016 Document Reviewed: 10/10/2016 °[employer] Interactive Patient Education © 2019 [employer] ° ° °Contraception Choices °Contraception, also called birth control, refers to methods or devices that prevent pregnancy. °Hormonal methods °Contraceptive implant ° °A contraceptive implant is a thin, plastic tube that contains a hormone. It is inserted into the upper part of the arm. It can remain in place for up to 3 years. °Progestin-only injections °Progestin-only injections are injections of progestin, a synthetic form of the hormone progesterone. They are given every 3 months by a health care provider. °Birth control pills ° °Birth control pills are pills that contain hormones that prevent pregnancy. They must be taken once a day, preferably at the same time each day. °Birth  control patch ° °The birth control patch contains hormones that prevent pregnancy. It is placed on the skin and must be changed once a week for three weeks and removed on the fourth week. A prescription is needed to use this method of contraception. °Vaginal ring ° °A vaginal ring contains hormones that prevent pregnancy. It is placed in the vagina for three weeks and removed on the fourth week. After that, the process is repeated with a new ring. A prescription is needed to use this method of contraception. °Emergency contraceptive °Emergency contraceptives prevent pregnancy after unprotected sex. They come in pill form and can be taken up to 5 days after sex. They work best the sooner they are taken after having sex. Most   emergency contraceptives are available without a prescription. This method should not be used as your only form of birth control. °Barrier methods °Female condom ° °A female condom is a thin sheath that is worn over the penis during sex. Condoms keep sperm from going inside a woman's body. They can be used with a spermicide to increase their effectiveness. They should be disposed after a single use. °Female condom ° °A female condom is a soft, loose-fitting sheath that is put into the vagina before sex. The condom keeps sperm from going inside a woman's body. They should be disposed after a single use. °Diaphragm ° °A diaphragm is a soft, dome-shaped barrier. It is inserted into the vagina before sex, along with a spermicide. The diaphragm blocks sperm from entering the uterus, and the spermicide kills sperm. A diaphragm should be left in the vagina for 6-8 hours after sex and removed within 24 hours. °A diaphragm is prescribed and fitted by a health care provider. A diaphragm should be replaced every 1-2 years, after giving birth, after gaining more than 15 lb (6.8 kg), and after pelvic surgery. °Cervical cap ° °A cervical cap is a round, soft latex or plastic cup that fits over the cervix. It is  inserted into the vagina before sex, along with spermicide. It blocks sperm from entering the uterus. The cap should be left in place for 6-8 hours after sex and removed within 48 hours. A cervical cap must be prescribed and fitted by a health care provider. It should be replaced every 2 years. °Sponge ° °A sponge is a soft, circular piece of polyurethane foam with spermicide on it. The sponge helps block sperm from entering the uterus, and the spermicide kills sperm. To use it, you make it wet and then insert it into the vagina. It should be inserted before sex, left in for at least 6 hours after sex, and removed and thrown away within 30 hours. °Spermicides °Spermicides are chemicals that kill or block sperm from entering the cervix and uterus. They can come as a cream, jelly, suppository, foam, or tablet. A spermicide should be inserted into the vagina with an applicator at least 10-15 minutes before sex to allow time for it to work. The process must be repeated every time you have sex. Spermicides do not require a prescription. °Intrauterine contraception °Intrauterine device (IUD) °An IUD is a T-shaped device that is put in a woman's uterus. There are two types: °· Hormone IUD.This type contains progestin, a synthetic form of the hormone progesterone. This type can stay in place for 3-5 years. °· Copper IUD.This type is wrapped in copper wire. It can stay in place for 10 years. ° °Permanent methods of contraception °Female tubal ligation °In this method, a woman's fallopian tubes are sealed, tied, or blocked during surgery to prevent eggs from traveling to the uterus. °Hysteroscopic sterilization °In this method, a small, flexible insert is placed into each fallopian tube. The inserts cause scar tissue to form in the fallopian tubes and block them, so sperm cannot reach an egg. The procedure takes about 3 months to be effective. Another form of birth control must be used during those 3 months. °Female  sterilization °This is a procedure to tie off the tubes that carry sperm (vasectomy). After the procedure, the man can still ejaculate fluid (semen). °Natural planning methods °Natural family planning °In this method, a couple does not have sex on days when the woman could become pregnant. °Calendar method °This means keeping   track of the length of each menstrual cycle, identifying the days when pregnancy can happen, and not having sex on those days. °Ovulation method °In this method, a couple avoids sex during ovulation. °Symptothermal method °This method involves not having sex during ovulation. The woman typically checks for ovulation by watching changes in her temperature and in the consistency of cervical mucus. °Post-ovulation method °In this method, a couple waits to have sex until after ovulation. °Summary °· Contraception, also called birth control, means methods or devices that prevent pregnancy. °· Hormonal methods of contraception include implants, injections, pills, patches, vaginal rings, and emergency contraceptives. °· Barrier methods of contraception can include female condoms, female condoms, diaphragms, cervical caps, sponges, and spermicides. °· There are two types of IUDs (intrauterine devices). An IUD can be put in a woman's uterus to prevent pregnancy for 3-5 years. °· Permanent sterilization can be done through a procedure for males, females, or both. °· Natural family planning methods involve not having sex on days when the woman could become pregnant. °This information is not intended to replace advice given to you by your health care provider. Make sure you discuss any questions you have with your health care provider. °Document Released: 09/04/2005 Document Revised: 09/06/2017 Document Reviewed: 10/07/2016 °[employer] Interactive Patient Education © 2019 [employer] ° ° °Breastfeeding ° °Choosing to breastfeed is one of the best decisions you can make for yourself and your baby. A change in  hormones during pregnancy causes your breasts to make breast milk in your milk-producing glands. Hormones prevent breast milk from being released before your baby is born. They also prompt milk flow after birth. Once breastfeeding has begun, thoughts of your baby, as well as his or her sucking or crying, can stimulate the release of milk from your milk-producing glands. °Benefits of breastfeeding °Research shows that breastfeeding offers many health benefits for infants and mothers. It also offers a cost-free and convenient way to feed your baby. °For your baby °· Your first milk (colostrum) helps your baby's digestive system to function better. °· Special cells in your milk (antibodies) help your baby to fight off infections. °· Breastfed babies are less likely to develop asthma, allergies, obesity, or type 2 diabetes. They are also at lower risk for sudden infant death syndrome (SIDS). °· Nutrients in breast milk are better able to meet your baby’s needs compared to infant formula. °· Breast milk improves your baby's brain development. °For you °· Breastfeeding helps to create a very special bond between you and your baby. °· Breastfeeding is convenient. Breast milk costs nothing and is always available at the correct temperature. °· Breastfeeding helps to burn calories. It helps you to lose the weight that you gained during pregnancy. °· Breastfeeding makes your uterus return faster to its size before pregnancy. It also slows bleeding (lochia) after you give birth. °· Breastfeeding helps to lower your risk of developing type 2 diabetes, osteoporosis, rheumatoid arthritis, cardiovascular disease, and breast, ovarian, uterine, and endometrial cancer later in life. °Breastfeeding basics °Starting breastfeeding °· Find a comfortable place to sit or lie down, with your neck and back well-supported. °· Place a pillow or a rolled-up blanket under your baby to bring him or her to the level of your breast (if you are  seated). Nursing pillows are specially designed to help support your arms and your baby while you breastfeed. °· Make sure that your baby's tummy (abdomen) is facing your abdomen. °· Gently massage your breast. With your fingertips, massage from   the outer edges of your breast inward toward the nipple. This encourages milk flow. If your milk flows slowly, you may need to continue this action during the feeding. °· Support your breast with 4 fingers underneath and your thumb above your nipple (make the letter "C" with your hand). Make sure your fingers are well away from your nipple and your baby’s mouth. °· Stroke your baby's lips gently with your finger or nipple. °· When your baby's mouth is open wide enough, quickly bring your baby to your breast, placing your entire nipple and as much of the areola as possible into your baby's mouth. The areola is the colored area around your nipple. °? More areola should be visible above your baby's upper lip than below the lower lip. °? Your baby's lips should be opened and extended outward (flanged) to ensure an adequate, comfortable latch. °? Your baby's tongue should be between his or her lower gum and your breast. °· Make sure that your baby's mouth is correctly positioned around your nipple (latched). Your baby's lips should create a seal on your breast and be turned out (everted). °· It is common for your baby to suck about 2-3 minutes in order to start the flow of breast milk. °Latching °Teaching your baby how to latch onto your breast properly is very important. An improper latch can cause nipple pain, decreased milk supply, and poor weight gain in your baby. Also, if your baby is not latched onto your nipple properly, he or she may swallow some air during feeding. This can make your baby fussy. Burping your baby when you switch breasts during the feeding can help to get rid of the air. However, teaching your baby to latch on properly is still the best way to prevent  fussiness from swallowing air while breastfeeding. °Signs that your baby has successfully latched onto your nipple °· Silent tugging or silent sucking, without causing you pain. Infant's lips should be extended outward (flanged). °· Swallowing heard between every 3-4 sucks once your milk has started to flow (after your let-down milk reflex occurs). °· Muscle movement above and in front of his or her ears while sucking. °Signs that your baby has not successfully latched onto your nipple °· Sucking sounds or smacking sounds from your baby while breastfeeding. °· Nipple pain. °If you think your baby has not latched on correctly, slip your finger into the corner of your baby’s mouth to break the suction and place it between your baby's gums. Attempt to start breastfeeding again. °Signs of successful breastfeeding °Signs from your baby °· Your baby will gradually decrease the number of sucks or will completely stop sucking. °· Your baby will fall asleep. °· Your baby's body will relax. °· Your baby will retain a small amount of milk in his or her mouth. °· Your baby will let go of your breast by himself or herself. °Signs from you °· Breasts that have increased in firmness, weight, and size 1-3 hours after feeding. °· Breasts that are softer immediately after breastfeeding. °· Increased milk volume, as well as a change in milk consistency and color by the fifth day of breastfeeding. °· Nipples that are not sore, cracked, or bleeding. °Signs that your baby is getting enough milk °· Wetting at least 1-2 diapers during the first 24 hours after birth. °· Wetting at least 5-6 diapers every 24 hours for the first week after birth. The urine should be clear or pale yellow by the age of 5 days. °·   Wetting 6-8 diapers every 24 hours as your baby continues to grow and develop. °· At least 3 stools in a 24-hour period by the age of 5 days. The stool should be soft and yellow. °· At least 3 stools in a 24-hour period by the age of 7  days. The stool should be seedy and yellow. °· No loss of weight greater than 10% of birth weight during the first 3 days of life. °· Average weight gain of 4-7 oz (113-198 g) per week after the age of 4 days. °· Consistent daily weight gain by the age of 5 days, without weight loss after the age of 2 weeks. °After a feeding, your baby may spit up a small amount of milk. This is normal. °Breastfeeding frequency and duration °Frequent feeding will help you make more milk and can prevent sore nipples and extremely full breasts (breast engorgement). Breastfeed when you feel the need to reduce the fullness of your breasts or when your baby shows signs of hunger. This is called "breastfeeding on demand." Signs that your baby is hungry include: °· Increased alertness, activity, or restlessness. °· Movement of the head from side to side. °· Opening of the mouth when the corner of the mouth or cheek is stroked (rooting). °· Increased sucking sounds, smacking lips, cooing, sighing, or squeaking. °· Hand-to-mouth movements and sucking on fingers or hands. °· Fussing or crying. °Avoid introducing a pacifier to your baby in the first 4-6 weeks after your baby is born. After this time, you may choose to use a pacifier. Research has shown that pacifier use during the first year of a baby's life decreases the risk of sudden infant death syndrome (SIDS). °Allow your baby to feed on each breast as long as he or she wants. When your baby unlatches or falls asleep while feeding from the first breast, offer the second breast. Because newborns are often sleepy in the first few weeks of life, you may need to awaken your baby to get him or her to feed. °Breastfeeding times will vary from baby to baby. However, the following rules can serve as a guide to help you make sure that your baby is properly fed: °· Newborns (babies 4 weeks of age or younger) may breastfeed every 1-3 hours. °· Newborns should not go without breastfeeding for longer  than 3 hours during the day or 5 hours during the night. °· You should breastfeed your baby a minimum of 8 times in a 24-hour period. °Breast milk pumping ° °  ° °Pumping and storing breast milk allows you to make sure that your baby is exclusively fed your breast milk, even at times when you are unable to breastfeed. This is especially important if you go back to work while you are still breastfeeding, or if you are not able to be present during feedings. Your lactation consultant can help you find a method of pumping that works best for you and give you guidelines about how long it is safe to store breast milk. °Caring for your breasts while you breastfeed °Nipples can become dry, cracked, and sore while breastfeeding. The following recommendations can help keep your breasts moisturized and healthy: °· Avoid using soap on your nipples. °· Wear a supportive bra designed especially for nursing. Avoid wearing underwire-style bras or extremely tight bras (sports bras). °· Air-dry your nipples for 3-4 minutes after each feeding. °· Use only cotton bra pads to absorb leaked breast milk. Leaking of breast milk between feedings is   normal. °· Use lanolin on your nipples after breastfeeding. Lanolin helps to maintain your skin's normal moisture barrier. Pure lanolin is not harmful (not toxic) to your baby. You may also hand express a few drops of breast milk and gently massage that milk into your nipples and allow the milk to air-dry. °In the first few weeks after giving birth, some women experience breast engorgement. Engorgement can make your breasts feel heavy, warm, and tender to the touch. Engorgement peaks within 3-5 days after you give birth. The following recommendations can help to ease engorgement: °· Completely empty your breasts while breastfeeding or pumping. You may want to start by applying warm, moist heat (in the shower or with warm, water-soaked hand towels) just before feeding or pumping. This increases  circulation and helps the milk flow. If your baby does not completely empty your breasts while breastfeeding, pump any extra milk after he or she is finished. °· Apply ice packs to your breasts immediately after breastfeeding or pumping, unless this is too uncomfortable for you. To do this: °? Put ice in a plastic bag. °? Place a towel between your skin and the bag. °? Leave the ice on for 20 minutes, 2-3 times a day. °· Make sure that your baby is latched on and positioned properly while breastfeeding. °If engorgement persists after 48 hours of following these recommendations, contact your health care provider or a lactation consultant. °Overall health care recommendations while breastfeeding °· Eat 3 healthy meals and 3 snacks every day. Well-nourished mothers who are breastfeeding need an additional 450-500 calories a day. You can meet this requirement by increasing the amount of a balanced diet that you eat. °· Drink enough water to keep your urine pale yellow or clear. °· Rest often, relax, and continue to take your prenatal vitamins to prevent fatigue, stress, and low vitamin and mineral levels in your body (nutrient deficiencies). °· Do not use any products that contain nicotine or tobacco, such as cigarettes and e-cigarettes. Your baby may be harmed by chemicals from cigarettes that pass into breast milk and exposure to secondhand smoke. If you need help quitting, ask your health care provider. °· Avoid alcohol. °· Do not use illegal drugs or marijuana. °· Talk with your health care provider before taking any medicines. These include over-the-counter and prescription medicines as well as vitamins and herbal supplements. Some medicines that may be harmful to your baby can pass through breast milk. °· It is possible to become pregnant while breastfeeding. If birth control is desired, ask your health care provider about options that will be safe while breastfeeding your baby. °Where to find more  information: °[employer]: www.llli.org °Contact a health care provider if: °· You feel like you want to stop breastfeeding or have become frustrated with breastfeeding. °· Your nipples are cracked or bleeding. °· Your breasts are red, tender, or warm. °· You have: °? Painful breasts or nipples. °? A swollen area on either breast. °? A fever or chills. °? Nausea or vomiting. °? Drainage other than breast milk from your nipples. °· Your breasts do not become full before feedings by the fifth day after you give birth. °· You feel sad and depressed. °· Your baby is: °? Too sleepy to eat well. °? Having trouble sleeping. °? More than 1 week old and wetting fewer than 6 diapers in a 24-hour period. °? Not gaining weight by 5 days of age. °· Your baby has fewer than 3 stools in a   24-hour period. °· Your baby's skin or the white parts of his or her eyes become yellow. °Get help right away if: °· Your baby is overly tired (lethargic) and does not want to wake up and feed. °· Your baby develops an unexplained fever. °Summary °· Breastfeeding offers many health benefits for infant and mothers. °· Try to breastfeed your infant when he or she shows early signs of hunger. °· Gently tickle or stroke your baby's lips with your finger or nipple to allow the baby to open his or her mouth. Bring the baby to your breast. Make sure that much of the areola is in your baby's mouth. Offer one side and burp the baby before you offer the other side. °· Talk with your health care provider or lactation consultant if you have questions or you face problems as you breastfeed. °This information is not intended to replace advice given to you by your health care provider. Make sure you discuss any questions you have with your health care provider. °Document Released: 09/04/2005 Document Revised: 10/06/2016 Document Reviewed: 10/06/2016 °Elsevier Interactive Patient Education © 2019 [employer] ° °

## 2018-10-08 NOTE — Progress Notes (Signed)
  Subjective:    Mandy Vasquez is a [id number] [redacted]w[redacted]d being seen today for her first obstetrical visit.  Her obstetrical history is significant for history of preterm delivery at 34 weeks due to PPROM and late onset to care at [redacted]w[redacted]d. Patient does intend to breast feed. Pregnancy history fully reviewed.  Patient reports no complaints.  Vitals:   10/08/18 0954  BP: 102/65  Pulse: 73  Weight: 126 lb (57.2 kg)    HISTORY: OB History  Gravida Para Term Preterm AB Living  6 3 2 1 2 3   SAB TAB Ectopic Multiple Live Births  2       3    # Outcome Date GA Lbr Len/2nd Weight Sex Delivery Anes PTL Lv  6 Current           5 Term 2018     Vag-Spont   LIV  4 Preterm 03/26/16 [redacted]w[redacted]d    Vag-Spont   LIV  3 SAB 2015 [redacted]w[redacted]d         2 SAB 2015          1 Term 04/30/13 [redacted]w[redacted]d   F Vag-Spont   LIV   Past Medical History:  Diagnosis Date  . Anxiety    Past Surgical History:  Procedure Laterality Date  . NO PAST SURGERIES     Family History  Problem Relation Age of Onset  . Diabetes Maternal Aunt   . Heart disease Maternal Aunt   . Diabetes Maternal Grandmother   . Heart disease Maternal Grandmother      Exam    Uterus:   20 weeks  Pelvic Exam:    Perineum: No Hemorrhoids, Normal Perineum   Vulva: normal   Vagina:  normal mucosa, normal discharge   pH:    Cervix: multiparous appearance and cervix is closed and log   Adnexa: normal adnexa and no mass, fullness, tenderness   Bony Pelvis: gynecoid  System: Breast:  normal appearance, no masses or tenderness   Skin: normal coloration and turgor, no rashes    Neurologic: oriented, no focal deficits   Extremities: normal strength, tone, and muscle mass   HEENT extra ocular movement intact   Mouth/Teeth mucous membranes moist, pharynx normal without lesions and dental hygiene good   Neck supple and no masses   Cardiovascular: regular rate and rhythm   Respiratory:  appears well, vitals normal, no respiratory distress, acyanotic, normal  RR, chest clear, no wheezing, crepitations, rhonchi, normal symmetric air entry   Abdomen: soft, non-tender; bowel sounds normal; no masses,  no organomegaly and gravid   Urinary:       Assessment:    Pregnancy: [id number] Patient Active Problem List   Diagnosis Date Noted  . Supervision of high risk pregnancy, antepartum 10/08/2018  . Prenatal care insufficient 10/08/2018  . History of preterm delivery, currently pregnant 04/02/2017        Plan:     Initial labs drawn. Prenatal vitamins. Problem list reviewed and updated. Genetic Screening discussed : panorama ordered.  Ultrasound discussed; fetal survey: ordered. Patient with history of preterm delivery- discussed the benefits of weekly 17-P injections which patient desires  Follow up in 4 weeks. 50% of 30 min visit spent on counseling and coordination of care.     Hilery Wintle 10/08/2018

## 2018-10-09 DIAGNOSIS — A749 Chlamydial infection, unspecified: Secondary | ICD-10-CM | POA: Insufficient documentation

## 2018-10-09 DIAGNOSIS — O98812 Other maternal infectious and parasitic diseases complicating pregnancy, second trimester: Secondary | ICD-10-CM

## 2018-10-09 LAB — CERVICOVAGINAL ANCILLARY ONLY
BACTERIAL VAGINITIS: POSITIVE — AB
CANDIDA VAGINITIS: POSITIVE — AB
Chlamydia: POSITIVE — AB
Neisseria Gonorrhea: NEGATIVE
Trichomonas: POSITIVE — AB

## 2018-10-09 MED ORDER — METRONIDAZOLE 500 MG PO TABS: 500.0000 mg | ORAL_TABLET | Freq: Two times a day (BID) | ORAL | 0 refills | Status: DC

## 2018-10-09 MED ORDER — AZITHROMYCIN 500 MG PO TABS: 1000.0000 mg | ORAL_TABLET | Freq: Once | ORAL | 1 refills | Status: AC

## 2018-10-09 MED ORDER — TERCONAZOLE 0.8 % VA CREA: 1.0000 | TOPICAL_CREAM | Freq: Every day | VAGINAL | 0 refills | Status: DC

## 2018-10-09 NOTE — Addendum Note (Signed)
Addended by: Catalina Antigua on: 10/09/2018 04:35 PM   Modules accepted: Orders

## 2018-10-10 ENCOUNTER — Ambulatory Visit (HOSPITAL_COMMUNITY)
Admission: RE | Admit: 2018-10-10 | Discharge: 2018-10-10 | Disposition: A | Discharge status: Home / Self Care | Payer: Medicaid Other | Source: Ambulatory Visit | Attending: Obstetrics and Gynecology | Admitting: Obstetrics and Gynecology

## 2018-10-10 ENCOUNTER — Other Ambulatory Visit (HOSPITAL_COMMUNITY): Payer: Self-pay | Admitting: *Deleted

## 2018-10-10 ENCOUNTER — Telehealth: Payer: Self-pay

## 2018-10-10 DIAGNOSIS — O09212 Supervision of pregnancy with history of pre-term labor, second trimester: Secondary | ICD-10-CM | POA: Diagnosis not present

## 2018-10-10 DIAGNOSIS — O0990 Supervision of high risk pregnancy, unspecified, unspecified trimester: Secondary | ICD-10-CM | POA: Diagnosis not present

## 2018-10-10 DIAGNOSIS — Z363 Encounter for antenatal screening for malformations: Secondary | ICD-10-CM

## 2018-10-10 DIAGNOSIS — Z362 Encounter for other antenatal screening follow-up: Secondary | ICD-10-CM

## 2018-10-10 DIAGNOSIS — Z3A19 19 weeks gestation of pregnancy: Secondary | ICD-10-CM | POA: Diagnosis not present

## 2018-10-10 LAB — CULTURE, OB URINE

## 2018-10-10 LAB — URINE CULTURE, OB REFLEX

## 2018-10-10 NOTE — Telephone Encounter (Signed)
Attempted to contact about results and rx sent, no answer, unable to leave vm. Form faxed to [facility].

## 2018-10-11 ENCOUNTER — Other Ambulatory Visit: Payer: Self-pay

## 2018-10-11 LAB — CYTOLOGY - PAP: Diagnosis: NEGATIVE

## 2018-10-14 NOTE — Telephone Encounter (Signed)
Sent My Chart message. Unable to reach pt via phone.

## 2018-10-15 ENCOUNTER — Ambulatory Visit: Payer: Medicaid Other

## 2018-10-17 ENCOUNTER — Encounter: Payer: Self-pay | Admitting: Obstetrics and Gynecology

## 2018-10-17 LAB — SMN1 COPY NUMBER ANALYSIS (SMA CARRIER SCREENING)

## 2018-10-17 LAB — OBSTETRIC PANEL, INCLUDING HIV
Antibody Screen: NEGATIVE
Basophils Absolute: 0 10*3/uL (ref 0.0–0.2)
Basos: 1 %
EOS (ABSOLUTE): 0.2 10*3/uL (ref 0.0–0.4)
EOS: 3 %
HEMATOCRIT: 34.7 % (ref 34.0–46.6)
HEMOGLOBIN: 11.2 g/dL (ref 11.1–15.9)
HEP B S AG: NEGATIVE
HIV SCREEN 4TH GENERATION: NONREACTIVE
Immature Grans (Abs): 0 10*3/uL (ref 0.0–0.1)
Immature Granulocytes: 0 %
LYMPHS ABS: 2 10*3/uL (ref 0.7–3.1)
Lymphs: 31 %
MCH: 28.1 pg (ref 26.6–33.0)
MCHC: 32.3 g/dL (ref 31.5–35.7)
MCV: 87 fL (ref 79–97)
Monocytes Absolute: 0.4 10*3/uL (ref 0.1–0.9)
Monocytes: 7 %
NEUTROS ABS: 3.9 10*3/uL (ref 1.4–7.0)
Neutrophils: 58 %
Platelets: 272 10*3/uL (ref 150–450)
RBC: 3.98 x10E6/uL (ref 3.77–5.28)
RDW: 12.1 % (ref 11.7–15.4)
RH TYPE: POSITIVE
RPR: NONREACTIVE
Rubella Antibodies, IGG: 1.71 index (ref >0.99)
WBC: 6.6 10*3/uL (ref 3.4–10.8)

## 2018-10-22 ENCOUNTER — Ambulatory Visit: Payer: Medicaid Other

## 2018-10-29 ENCOUNTER — Ambulatory Visit: Payer: Medicaid Other

## 2018-10-31 ENCOUNTER — Encounter: Payer: Self-pay | Admitting: Advanced Practice Midwife

## 2018-11-04 ENCOUNTER — Ambulatory Visit (HOSPITAL_COMMUNITY): Payer: Medicaid Other

## 2018-11-05 ENCOUNTER — Ambulatory Visit (INDEPENDENT_AMBULATORY_CARE_PROVIDER_SITE_OTHER): Payer: Medicaid Other | Admitting: Advanced Practice Midwife

## 2018-11-05 ENCOUNTER — Encounter: Payer: Medicaid Other | Admitting: Obstetrics and Gynecology

## 2018-11-05 VITALS — BP 102/67 | HR 78 | Wt 133.0 lb

## 2018-11-05 DIAGNOSIS — O0990 Supervision of high risk pregnancy, unspecified, unspecified trimester: Secondary | ICD-10-CM

## 2018-11-05 DIAGNOSIS — N949 Unspecified condition associated with female genital organs and menstrual cycle: Secondary | ICD-10-CM

## 2018-11-05 DIAGNOSIS — O23592 Infection of other part of genital tract in pregnancy, second trimester: Secondary | ICD-10-CM

## 2018-11-05 DIAGNOSIS — O98812 Other maternal infectious and parasitic diseases complicating pregnancy, second trimester: Secondary | ICD-10-CM

## 2018-11-05 DIAGNOSIS — A5901 Trichomonal vulvovaginitis: Secondary | ICD-10-CM

## 2018-11-05 DIAGNOSIS — O09219 Supervision of pregnancy with history of pre-term labor, unspecified trimester: Secondary | ICD-10-CM

## 2018-11-05 DIAGNOSIS — Z3A23 23 weeks gestation of pregnancy: Secondary | ICD-10-CM

## 2018-11-05 DIAGNOSIS — O09899 Supervision of other high risk pregnancies, unspecified trimester: Secondary | ICD-10-CM

## 2018-11-05 DIAGNOSIS — A749 Chlamydial infection, unspecified: Secondary | ICD-10-CM

## 2018-11-05 MED ORDER — METRONIDAZOLE 500 MG PO TABS: ORAL_TABLET | ORAL | 1 refills | Status: DC

## 2018-11-05 MED ORDER — AZITHROMYCIN 500 MG PO TABS: 1000.0000 mg | ORAL_TABLET | Freq: Once | ORAL | 1 refills | Status: AC

## 2018-11-05 MED ORDER — CONCEPT OB 130-92.4-1 MG PO CAPS: 1.0000 | ORAL_CAPSULE | Freq: Every day | ORAL | 11 refills | Status: DC

## 2018-11-05 MED ORDER — COMFORT FIT MATERNITY SUPP MED MISC: 1.0000 | Freq: Every day | 0 refills | Status: DC

## 2018-11-05 MED ORDER — ONDANSETRON 8 MG PO TBDP: 8.0000 mg | ORAL_TABLET | Freq: Three times a day (TID) | ORAL | 0 refills | Status: DC | PRN

## 2018-11-05 NOTE — Progress Notes (Signed)
   PRENATAL VISIT NOTE  Subjective:  Mandy Vasquez is a 28 y.o. [id number] at [redacted]w[redacted]d being seen today for ongoing prenatal care.  She is currently monitored for the following issues for this high-risk pregnancy and has History of preterm delivery, currently pregnant; Supervision of high risk pregnancy, antepartum; Prenatal care insufficient; and Chlamydia infection affecting pregnancy in second trimester on their problem list.  Patient reports occasional pain in LLQ, radiates down into groin, none today.  Contractions: Not present. Vag. Bleeding: None.  Movement: Present. Denies leaking of fluid.   The following portions of the patient's history were reviewed and updated as appropriate: allergies, current medications, past family history, past medical history, past social history, past surgical history and problem list. Problem list updated.  Objective:   Vitals:   11/05/18 1119  BP: 102/67  Pulse: 78  Weight: 60.3 kg    Fetal Status:     Movement: Present     General:  Alert, oriented and cooperative. Patient is in no acute distress.  Skin: Skin is warm and dry. No rash noted.   Cardiovascular: Normal heart rate noted  Respiratory: Normal respiratory effort, no problems with respiration noted  Abdomen: Soft, gravid, appropriate for gestational age.  Pain/Pressure: Absent     Pelvic: Cervical exam deferred        Extremities: Normal range of motion.     Mental Status: Normal mood and affect. Normal behavior. Normal judgment and thought content.   Assessment and Plan:  Pregnancy: [id number] at [redacted]w[redacted]d  1. Supervision of high risk pregnancy, antepartum --Anticipatory guidance about next visits/weeks of pregnancy given.   2. History of preterm delivery, currently pregnant --Previous 34 week delivery followed by term delivery --Discussed 17-P, recommended for prevention of preterm birth.  Questions answered. Discussed mixed results in recent studies but ACOG still recommends, more studies  needed to see who benefits most. --Pt did not take in previous pregnancy. --Pt declines 17-P this pregnancy  3. Trichomonal vaginitis during pregnancy in second trimester --Pt did not receive message about test results from 1/21 so untreated --Rx for Flagyl 2 g sent to pt pharmacy, refill for her female partner to use for expedited partner therapy. --Pt reports partner was treated (for chlamydia and maybe trichomonas) several months ago and he reports it must be the old infection not new infection --With recent positive test for pt, I cannot confirm when she acquired infection but I recommend she and partner are treated now, wait 10 days before intercourse.  4. Chlamydia infection affecting pregnancy in second trimester --See above --Azithromycin 1000 mg PO x 1 dose sent to pharmacy with refill for partner.  5. Pain of round ligament --Rest/ice/heat/warm bath/Tylenol/pregnancy support belt - Elastic Bandages & Supports (COMFORT FIT MATERNITY SUPP MED) MISC; 1 Device by Does not apply route daily.  Dispense: 1 each; Refill: 0  Preterm labor symptoms and general obstetric precautions including but not limited to vaginal bleeding, contractions, leaking of fluid and fetal movement were reviewed in detail with the patient. Please refer to After Visit Summary for other counseling recommendations.  Return in about 4 weeks (around 12/03/2018).  Future Appointments  Date Time Provider Department Center  11/06/2018  3:15 PM [facility] [place] [facility]  12/02/2018  8:45 AM [facility] None  12/02/2018  9:30 AM [facility], Wilmer Floor, CNM [facility] None    Sharen Counter, CNM

## 2018-11-05 NOTE — Patient Instructions (Signed)

## 2018-11-05 NOTE — Addendum Note (Signed)
Addended by: Sharen Counter A on: 11/05/2018 01:23 PM   Modules accepted: Orders

## 2018-11-06 ENCOUNTER — Ambulatory Visit (HOSPITAL_COMMUNITY)
Admission: RE | Admit: 2018-11-06 | Discharge: 2018-11-06 | Disposition: A | Discharge status: Home / Self Care | Payer: Medicaid Other | Source: Ambulatory Visit | Attending: Obstetrics and Gynecology | Admitting: Obstetrics and Gynecology

## 2018-11-06 DIAGNOSIS — O0932 Supervision of pregnancy with insufficient antenatal care, second trimester: Secondary | ICD-10-CM

## 2018-11-06 DIAGNOSIS — Z362 Encounter for other antenatal screening follow-up: Secondary | ICD-10-CM | POA: Insufficient documentation

## 2018-11-06 DIAGNOSIS — O09212 Supervision of pregnancy with history of pre-term labor, second trimester: Secondary | ICD-10-CM | POA: Diagnosis not present

## 2018-11-06 DIAGNOSIS — Z3A23 23 weeks gestation of pregnancy: Secondary | ICD-10-CM | POA: Diagnosis not present

## 2018-11-07 ENCOUNTER — Other Ambulatory Visit (HOSPITAL_COMMUNITY): Payer: Self-pay | Admitting: *Deleted

## 2018-11-07 DIAGNOSIS — O09212 Supervision of pregnancy with history of pre-term labor, second trimester: Principal | ICD-10-CM

## 2018-11-07 DIAGNOSIS — O09892 Supervision of other high risk pregnancies, second trimester: Secondary | ICD-10-CM

## 2018-11-14 ENCOUNTER — Telehealth: Payer: Self-pay

## 2018-11-14 NOTE — Telephone Encounter (Signed)
Patient called in c/o if our office does lab work checking for chickenpox (varacella) . Advised patient that is one of the questions we ask " if she has ever had chicken pox or been vaccinated against it" during her Initial OB visit. We do not typically specifically test for that unless it is deemed necessary. Patient stated she understood  - was just wondering since she was at her daughter's appt having her vaccinations done - patient had no further questions.

## 2018-12-02 ENCOUNTER — Other Ambulatory Visit: Payer: Medicaid Other

## 2018-12-02 ENCOUNTER — Telehealth: Payer: Self-pay | Admitting: Advanced Practice Midwife

## 2018-12-02 ENCOUNTER — Encounter: Payer: Medicaid Other | Admitting: Advanced Practice Midwife

## 2018-12-02 NOTE — Telephone Encounter (Signed)
Pt no show/late to today's prenatal visit on 12/02/18.  Called pt because she is scheduled to have TOC for chlamydia and trichomonas and 2 hour GTT at today's visit.  Pt voicemail has not been set up so unable to leave a message.    Pt was positive for chlamydia and trichomonas on 1/21, was unable to be reached by phone so was not treated until 2/18 at her prenatal visit.  Partner was also treated at that time.  Pt has hx of preterm labor so urgent follow up/testing is needed.

## 2018-12-02 NOTE — Progress Notes (Deleted)
   PRENATAL VISIT NOTE  Subjective:  Mandy Vasquez is a 28 y.o. [id number] at [redacted]w[redacted]d being seen today for ongoing prenatal care.  She is currently monitored for the following issues for this {Blank single:19197::"high-risk","low-risk"} pregnancy and has History of preterm delivery, currently pregnant; Supervision of high risk pregnancy, antepartum; Prenatal care insufficient; and Chlamydia infection affecting pregnancy in second trimester on their problem list.  Patient reports {sx:[id number]}.   .  .   . Denies leaking of fluid.   The following portions of the patient's history were reviewed and updated as appropriate: allergies, current medications, past family history, past medical history, past social history, past surgical history and problem list.   Objective:  There were no vitals filed for this visit.  Fetal Status:           General:  Alert, oriented and cooperative. Patient is in no acute distress.  Skin: Skin is warm and dry. No rash noted.   Cardiovascular: Normal heart rate noted  Respiratory: Normal respiratory effort, no problems with respiration noted  Abdomen: Soft, gravid, appropriate for gestational age.        Pelvic: {Blank single:19197::"Cervical exam performed","Cervical exam deferred"}        Extremities: Normal range of motion.     Mental Status: Normal mood and affect. Normal behavior. Normal judgment and thought content.   Assessment and Plan:  Pregnancy: [id number] at [redacted]w[redacted]d 1. Supervision of high risk pregnancy, antepartum ***  2. History of preterm delivery, currently pregnant ***  3. Trichomonal vaginitis during pregnancy in second trimester --TOC today  4. Chlamydia infection affecting pregnancy in second trimester --TOC today  {Blank single:19197::"Term","Preterm"} labor symptoms and general obstetric precautions including but not limited to vaginal bleeding, contractions, leaking of fluid and fetal movement were reviewed in detail with the patient. Please  refer to After Visit Summary for other counseling recommendations.   No follow-ups on file.  Future Appointments  Date Time Provider Department Center  12/02/2018  9:30 AM Hurshel Party, CNM [facility] None  12/04/2018  2:00 PM [facility] NURSE [facility]  12/04/2018  2:15 PM [facility] [place] 4 [facility]    Sharen Counter, CNM

## 2018-12-04 ENCOUNTER — Ambulatory Visit (HOSPITAL_COMMUNITY): Payer: Medicaid Other | Attending: Obstetrics and Gynecology

## 2018-12-04 ENCOUNTER — Ambulatory Visit (HOSPITAL_COMMUNITY): Payer: Medicaid Other

## 2018-12-06 ENCOUNTER — Ambulatory Visit (HOSPITAL_COMMUNITY)
Admission: RE | Admit: 2018-12-06 | Discharge: 2018-12-06 | Disposition: A | Discharge status: Home / Self Care | Payer: Medicaid Other | Source: Ambulatory Visit | Attending: Obstetrics and Gynecology | Admitting: Obstetrics and Gynecology

## 2018-12-06 ENCOUNTER — Other Ambulatory Visit: Payer: Self-pay

## 2018-12-06 ENCOUNTER — Encounter (HOSPITAL_COMMUNITY): Payer: Self-pay

## 2018-12-06 ENCOUNTER — Ambulatory Visit (HOSPITAL_COMMUNITY): Payer: Medicaid Other | Admitting: *Deleted

## 2018-12-06 VITALS — BP 117/66 | HR 73 | Temp 98.6°F | Wt 139.2 lb

## 2018-12-06 DIAGNOSIS — O09212 Supervision of pregnancy with history of pre-term labor, second trimester: Secondary | ICD-10-CM | POA: Diagnosis not present

## 2018-12-06 DIAGNOSIS — Z362 Encounter for other antenatal screening follow-up: Secondary | ICD-10-CM

## 2018-12-06 DIAGNOSIS — Z3A27 27 weeks gestation of pregnancy: Secondary | ICD-10-CM | POA: Diagnosis not present

## 2018-12-06 DIAGNOSIS — O0990 Supervision of high risk pregnancy, unspecified, unspecified trimester: Secondary | ICD-10-CM | POA: Insufficient documentation

## 2018-12-06 DIAGNOSIS — O0932 Supervision of pregnancy with insufficient antenatal care, second trimester: Secondary | ICD-10-CM | POA: Diagnosis not present

## 2018-12-06 DIAGNOSIS — O359XX0 Maternal care for (suspected) fetal abnormality and damage, unspecified, not applicable or unspecified: Secondary | ICD-10-CM | POA: Diagnosis not present

## 2018-12-06 DIAGNOSIS — O09892 Supervision of other high risk pregnancies, second trimester: Secondary | ICD-10-CM

## 2018-12-09 ENCOUNTER — Other Ambulatory Visit: Payer: Medicaid Other

## 2018-12-09 ENCOUNTER — Encounter: Payer: Medicaid Other | Admitting: Medical

## 2018-12-09 ENCOUNTER — Other Ambulatory Visit (HOSPITAL_COMMUNITY): Payer: Self-pay | Admitting: *Deleted

## 2018-12-09 DIAGNOSIS — Z3689 Encounter for other specified antenatal screening: Secondary | ICD-10-CM

## 2018-12-26 ENCOUNTER — Inpatient Hospital Stay (HOSPITAL_COMMUNITY)
Admission: AD | Admit: 2018-12-26 | Discharge: 2018-12-26 | Disposition: A | Discharge status: Home / Self Care | Payer: Medicaid Other | Attending: Obstetrics & Gynecology | Admitting: Obstetrics & Gynecology

## 2018-12-26 ENCOUNTER — Other Ambulatory Visit: Payer: Self-pay

## 2018-12-26 ENCOUNTER — Encounter (HOSPITAL_COMMUNITY): Payer: Self-pay | Admitting: *Deleted

## 2018-12-26 ENCOUNTER — Encounter: Payer: Self-pay | Admitting: Advanced Practice Midwife

## 2018-12-26 DIAGNOSIS — R21 Rash and other nonspecific skin eruption: Secondary | ICD-10-CM | POA: Insufficient documentation

## 2018-12-26 DIAGNOSIS — R55 Syncope and collapse: Secondary | ICD-10-CM

## 2018-12-26 DIAGNOSIS — Z3A30 30 weeks gestation of pregnancy: Secondary | ICD-10-CM

## 2018-12-26 DIAGNOSIS — A5901 Trichomonal vulvovaginitis: Secondary | ICD-10-CM | POA: Diagnosis not present

## 2018-12-26 DIAGNOSIS — O26893 Other specified pregnancy related conditions, third trimester: Secondary | ICD-10-CM | POA: Diagnosis not present

## 2018-12-26 DIAGNOSIS — A568 Sexually transmitted chlamydial infection of other sites: Secondary | ICD-10-CM | POA: Diagnosis not present

## 2018-12-26 DIAGNOSIS — R42 Dizziness and giddiness: Secondary | ICD-10-CM | POA: Insufficient documentation

## 2018-12-26 DIAGNOSIS — R109 Unspecified abdominal pain: Secondary | ICD-10-CM | POA: Diagnosis not present

## 2018-12-26 DIAGNOSIS — R232 Flushing: Secondary | ICD-10-CM | POA: Insufficient documentation

## 2018-12-26 DIAGNOSIS — O23593 Infection of other part of genital tract in pregnancy, third trimester: Secondary | ICD-10-CM | POA: Diagnosis not present

## 2018-12-26 DIAGNOSIS — O98313 Other infections with a predominantly sexual mode of transmission complicating pregnancy, third trimester: Secondary | ICD-10-CM | POA: Insufficient documentation

## 2018-12-26 DIAGNOSIS — Z3689 Encounter for other specified antenatal screening: Secondary | ICD-10-CM | POA: Diagnosis not present

## 2018-12-26 DIAGNOSIS — R110 Nausea: Secondary | ICD-10-CM | POA: Insufficient documentation

## 2018-12-26 LAB — COMPREHENSIVE METABOLIC PANEL
ALT: 11 U/L (ref 0–44)
AST: 15 U/L (ref 15–41)
Albumin: 3 g/dL — ABNORMAL LOW (ref 3.5–5.0)
Alkaline Phosphatase: 112 U/L (ref 38–126)
Anion gap: 8 (ref 5–15)
BUN: 13 mg/dL (ref 6–20)
CO2: 22 mmol/L (ref 22–32)
Calcium: 8.4 mg/dL — ABNORMAL LOW (ref 8.9–10.3)
Chloride: 106 mmol/L (ref 98–111)
Creatinine, Ser: 0.57 mg/dL (ref 0.44–1.00)
GFR calc Af Amer: >60 mL/min (ref >60)
GFR calc non Af Amer: >60 mL/min (ref >60)
Glucose, Bld: 80 mg/dL (ref 70–99)
Potassium: 3.7 mmol/L (ref 3.5–5.1)
Sodium: 136 mmol/L (ref 135–145)
Total Bilirubin: 0.4 mg/dL (ref 0.3–1.2)
Total Protein: 6.5 g/dL (ref 6.5–8.1)

## 2018-12-26 LAB — CBC
HCT: 31.4 % — ABNORMAL LOW (ref 36.0–46.0)
Hemoglobin: 10.1 g/dL — ABNORMAL LOW (ref 12.0–15.0)
MCH: 28.2 pg (ref 26.0–34.0)
MCHC: 32.2 g/dL (ref 30.0–36.0)
MCV: 87.7 fL (ref 80.0–100.0)
Platelets: 178 10*3/uL (ref 150–400)
RBC: 3.58 MIL/uL — ABNORMAL LOW (ref 3.87–5.11)
RDW: 12.9 % (ref 11.5–15.5)
WBC: 8.1 10*3/uL (ref 4.0–10.5)
nRBC: 0 % (ref 0.0–0.2)

## 2018-12-26 LAB — URINALYSIS, ROUTINE W REFLEX MICROSCOPIC
Bilirubin Urine: NEGATIVE
Glucose, UA: NEGATIVE mg/dL
Hgb urine dipstick: NEGATIVE
Ketones, ur: NEGATIVE mg/dL
Leukocytes,Ua: NEGATIVE
Nitrite: NEGATIVE
Protein, ur: NEGATIVE mg/dL
Specific Gravity, Urine: 1.029 (ref 1.005–1.030)
pH: 6 (ref 5.0–8.0)

## 2018-12-26 LAB — GLUCOSE, CAPILLARY: Glucose-Capillary: 76 mg/dL (ref 70–99)

## 2018-12-26 MED ORDER — LACTATED RINGERS IV BOLUS
1000.0000 mL | Freq: Once | INTRAVENOUS | Status: AC
  Administered 2018-12-26: 12:00:00 1000 mL via INTRAVENOUS

## 2018-12-26 MED ORDER — METRONIDAZOLE 500 MG PO TABS: 2000.0000 mg | ORAL_TABLET | Freq: Once | ORAL | 1 refills | Status: AC

## 2018-12-26 MED ORDER — AZITHROMYCIN 500 MG PO TABS: 1000.0000 mg | ORAL_TABLET | Freq: Once | ORAL | 1 refills | Status: AC

## 2018-12-26 NOTE — MAU Provider Note (Signed)
History     CSN: [id number]  Arrival date and time: 12/26/18 0940    Chief Complaint  Patient presents with  . Fatigue  . Dizziness  . Abdominal Pain  . Rupture of Membranes   [name] is a 28 y.o. [id number] at [redacted]w[redacted]d who presents to MAU for syncope and ctx.  Pt reports last night around 0800 while lying in bed she experienced a "pins and needles" sensation in her legs. Pt thought the baby's position might be causing this, so she stood up to try to get the baby to move. As she was changing position, pt reports she started seeing spots, got a hot flash, was nauseous, sweating and felt pale. This is the last thing the patient remembers before waking up spontaneously, lying on her back on her bed with her feet on the floor. Pt then reports she crawled to the kitchen to get water and gatorade, but threw up all the liquids she tried to drink. Pt reports she would have come in last night, but there was no one to watch her children.  Pt denies concurrent chest pain, palpitations or difficulty breathing with syncopal episode. Pt denies abdominal pain and new onset back pain with syncope.  Pt denies N/V at this time.  Pt also reports increased contractions since 11/26/2018 when she fell on the floor and hit the side of her abdomen. Pt reports she called her OB office after the incident and was told not to be evaluated despite having ctx after the fall. Pt reports she did not present to the hospital and did not get monitored for 4hrs after her fall. Pt reports she was instead told to rest and count her ctx. Pt reports ctx have gotten stronger and closer together ever since this event and are especially painful at night when the baby is moving the most.  Pt denies VB, LOF, decreased FM, vaginal discharge/odor/itching. Pt denies abdominal pain, constipation, diarrhea, or urinary problems. Pt denies fever, chills, fatigue, or changes in appetite. Pt denies SOB or chest pain. Pt denies  dizziness, HA, light-headedness, weakness.  Problems this pregnancy include: PTL/PTD (pt not taking 17-P). Allergies? NKDA (latex, onion) Current medications/supplements? PNVs Prenatal care provider? [facility], does not have next appt scheduled  Pt has not taken medication for +trich/CT and reports the prescriptions have been "lost" by her pharmacy. Pt requesting medication at this time for her and her partner.   OB History    Gravida  6   Para  3   Term  2   Preterm  1   AB  2   Living  3     SAB  2   TAB      Ectopic      Multiple      Live Births  3           Past Medical History:  Diagnosis Date  . Anxiety     Past Surgical History:  Procedure Laterality Date  . NO PAST SURGERIES      Family History  Problem Relation Age of Onset  . Diabetes Maternal Aunt   . Heart disease Maternal Aunt   . Diabetes Maternal Grandmother   . Heart disease Maternal Grandmother     Social History   Tobacco Use  . Smoking status: Never Smoker  . Smokeless tobacco: Never Used  Substance Use Topics  . Alcohol use: No  . Drug use: Yes    Frequency: 2.0 times per week  Types: Marijuana    Comment: not used in 1 month    Allergies:  Allergies  Allergen Reactions  . Latex Swelling  . Onion Swelling    Medications Prior to Admission  Medication Sig Dispense Refill Last Dose  . Prenat w/o A Vit-FeFum-FePo-FA (CONCEPT OB) 130-92.4-1 MG CAPS Take 1 capsule by mouth daily. 30 capsule 11 12/25/2018 at Unknown time  . Elastic Bandages & Supports (COMFORT FIT MATERNITY SUPP MED) MISC 1 Device by Does not apply route daily. 1 each 0 Taking  . ferrous sulfate 325 (65 FE) MG tablet Take 1 tablet by mouth daily.   Not Taking  . metroNIDAZOLE (FLAGYL) 500 MG tablet Take 1 tablet (500 mg total) by mouth 2 (two) times daily. (Patient not taking: Reported on 12/06/2018) 14 tablet 0 Not Taking  . metroNIDAZOLE (FLAGYL) 500 MG tablet Take two tablets by mouth twice a day, for  one day.  Or you can take all four tablets at once if you can tolerate it. (Patient not taking: Reported on 12/06/2018) 4 tablet 1 Not Taking  . ondansetron (ZOFRAN-ODT) 8 MG disintegrating tablet Take 1 tablet (8 mg total) by mouth every 8 (eight) hours as needed for nausea or vomiting. (Patient not taking: Reported on 12/06/2018) 20 tablet 0 More than a month at Unknown time  . Prenatal Vit-Fe Fumarate-FA (PRENATAL MULTIVITAMIN) TABS tablet Take 1 tablet by mouth daily at 12 noon.   Not Taking  . terconazole (TERAZOL 3) 0.8 % vaginal cream Place 1 applicator vaginally at bedtime. Apply nightly for three nights. (Patient not taking: Reported on 12/06/2018) 20 g 0 Not Taking    Review of Systems  Constitutional: Positive for diaphoresis. Negative for chills, fatigue and fever.  Respiratory: Negative for shortness of breath.   Cardiovascular: Negative for chest pain and palpitations.  Gastrointestinal: Positive for nausea and vomiting. Negative for abdominal pain, constipation and diarrhea.  Genitourinary: Positive for pelvic pain (ctx). Negative for dysuria, flank pain, frequency, urgency, vaginal bleeding and vaginal discharge.  Musculoskeletal: Negative for back pain.  Skin: Positive for pallor.  Neurological: Positive for syncope. Negative for dizziness, weakness, light-headedness and headaches.   Physical Exam   Blood pressure 99/70, pulse 78, temperature 98.5 F (36.9 C), temperature source Oral, resp. rate 17, weight 64 kg, last menstrual period 05/25/2018, SpO2 100 %, unknown if currently breastfeeding.  Patient Vitals for the past 24 hrs:  BP Temp Temp src Pulse Resp SpO2 Weight  12/26/18 1517 - - - - 17 - -  12/26/18 1512 - - - - 17 - -  12/26/18 1509 - - - - 17 - -  12/26/18 1506 99/70 - - - 17 - -  12/26/18 1007 108/65 98.5 F (36.9 C) Oral 78 16 100 % 64 kg    Physical Exam  Constitutional: She is oriented to person, place, and time. She appears well-developed and  well-nourished. No distress.  HENT:  Head: Normocephalic and atraumatic.  Respiratory: Effort normal.  GI: Soft. She exhibits no distension and no mass. There is no abdominal tenderness. There is no rebound and no guarding.  Genitourinary: There is no rash, tenderness or lesion on the right labia. There is no rash, tenderness or lesion on the left labia.    Genitourinary Comments: CE: long/posterior/external os dilated 1cm, internal os closed   Neurological: She is alert and oriented to person, place, and time.  Skin: Skin is warm and dry. She is not diaphoretic.  Psychiatric: She has a normal mood  and affect. Her behavior is normal.   Results for orders placed or performed during the hospital encounter of 12/26/18 (from the past 24 hour(s))  Urinalysis, Routine w reflex microscopic     Status: None   Collection Time: 12/26/18 11:03 AM  Result Value Ref Range   Color, Urine YELLOW YELLOW   APPearance CLEAR CLEAR   Specific Gravity, Urine 1.029 1.005 - 1.030   pH 6.0 5.0 - 8.0   Glucose, UA NEGATIVE NEGATIVE mg/dL   Hgb urine dipstick NEGATIVE NEGATIVE   Bilirubin Urine NEGATIVE NEGATIVE   Ketones, ur NEGATIVE NEGATIVE mg/dL   Protein, ur NEGATIVE NEGATIVE mg/dL   Nitrite NEGATIVE NEGATIVE   Leukocytes,Ua NEGATIVE NEGATIVE  Glucose, capillary     Status: None   Collection Time: 12/26/18 11:53 AM  Result Value Ref Range   Glucose-Capillary 76 70 - 99 mg/dL  CBC     Status: Abnormal   Collection Time: 12/26/18 11:54 AM  Result Value Ref Range   WBC 8.1 4.0 - 10.5 K/uL   RBC 3.58 (L) 3.87 - 5.11 MIL/uL   Hemoglobin 10.1 (L) 12.0 - 15.0 g/dL   HCT 09.8 (L) 11.9 - 14.7 %   MCV 87.7 80.0 - 100.0 fL   MCH 28.2 26.0 - 34.0 pg   MCHC 32.2 30.0 - 36.0 g/dL   RDW 82.9 56.2 - 13.0 %   Platelets 178 150 - 400 K/uL   nRBC 0.0 0.0 - 0.2 %  Comprehensive metabolic panel     Status: Abnormal   Collection Time: 12/26/18 11:54 AM  Result Value Ref Range   Sodium 136 135 - 145 mmol/L    Potassium 3.7 3.5 - 5.1 mmol/L   Chloride 106 98 - 111 mmol/L   CO2 22 22 - 32 mmol/L   Glucose, Bld 80 70 - 99 mg/dL   BUN 13 6 - 20 mg/dL   Creatinine, Ser 8.65 0.44 - 1.00 mg/dL   Calcium 8.4 (L) 8.9 - 10.3 mg/dL   Total Protein 6.5 6.5 - 8.1 g/dL   Albumin 3.0 (L) 3.5 - 5.0 g/dL   AST 15 15 - 41 U/L   ALT 11 0 - 44 U/L   Alkaline Phosphatase 112 38 - 126 U/L   Total Bilirubin 0.4 0.3 - 1.2 mg/dL   GFR calc non Af Amer >60 >60 mL/min   GFR calc Af Amer >60 >60 mL/min   Anion gap 8 5 - 15    [place] [facility] Ob Follow Up  Result Date: 12/06/2018 ----------------------------------------------------------------------  OBSTETRICS REPORT                       (Signed Final 12/06/2018 11:29 am) ---------------------------------------------------------------------- Patient Info  ID #:       [id number]                          D.O.B.:  1991-05-25 (27 yrs)  Name:       MWUXLKG Chisolm                  Visit Date: 12/06/2018 10:35 am ---------------------------------------------------------------------- Performed By  Performed By:     Tomma Lightning             Ref. Address:     Faculty                    RDMS,RVT  Attending:        Daleen Bo  Judeth Cornfield MD        [facility]  Referred By:      Catalina Antigua MD ---------------------------------------------------------------------- Orders   #  Description                          Code         Ordered By   1  [place] [facility] OB FOLLOW UP                  16109.60     Noralee Space  ----------------------------------------------------------------------   #  Order #                    Accession #                 Episode #   1  [id number]                  [id number]                  [id number]  ---------------------------------------------------------------------- Indications   Encounter for other antenatal screening        Z36.2   follow-up   [redacted] weeks gestation of pregnancy                 Z3A.27   Poor obstetric history: Previous preterm       O09.219   delivery, antepartum (@34  weeks)   Insufficient Prenatal Care                     O09.30   Fetal abnormality - other known or             O35.9XX0   suspected (EIFLV)  ---------------------------------------------------------------------- Vital Signs  Weight (lb): 139                               Height:        5'10"  BMI:         19.94        Pulse:  73  BP:          117/66       Temp:   98.6 ---------------------------------------------------------------------- Fetal Evaluation  Num Of Fetuses:         1  Fetal Heart Rate(bpm):  154  Cardiac Activity:       Observed  Presentation:           Breech  Placenta:               Posterior  P. Cord Insertion:      Previously Visualized  Amniotic Fluid  AFI FV:      Within normal limits  AFI Sum(cm)     %Tile       Largest Pocket(cm)  10.74           17  2.82  RUQ(cm)       RLQ(cm)       LUQ(cm)        LLQ(cm)  2.67          2.82          2.67           2.58 ---------------------------------------------------------------------- Biometry  BPD:      60.1  mm     G. Age:  24w 4d        < 1  %    CI:        70.03   %    70 - 86                                                          FL/HC:      20.7   %    18.8 - 20.6  HC:      229.1  mm     G. Age:  24w 6d        < 3  %    HC/AC:      1.05        1.05 - 1.21  AC:      218.8  mm     G. Age:  26w 3d          8  %    FL/BPD:     79.0   %    71 - 87  FL:       47.5  mm     G. Age:  25w 6d        < 3  %    FL/AC:      21.7   %    20 - 24  HUM:      42.8  mm     G. Age:  25w 4d        < 5  %  LV:        3.1  mm  Est. FW:     870  gm    1 lb 15 oz      18  % ---------------------------------------------------------------------- OB History  Gravidity:    6         Term:   3        Prem:   1        SAB:   2  TOP:          0       Ectopic:  0        Living: 3 ---------------------------------------------------------------------- Gestational Age  LMP:            27w 6d        Date:  05/25/18                 EDD:   03/01/19  U/S Today:     25w 3d                                        EDD:   03/18/19  Best:          27w 6d     Det. By:  LMP  (05/25/18)  EDD:   03/01/19 ---------------------------------------------------------------------- Anatomy  Cranium:               Appears normal         Aortic Arch:            Previously seen  Cavum:                 Previously seen        Ductal Arch:            Previously seen  Ventricles:            Appears normal         Diaphragm:              Appears normal  Choroid Plexus:        Previously seen        Stomach:                Appears normal, left                                                                        sided  Cerebellum:            Previously seen        Abdomen:                Appears normal  Posterior Fossa:       Previously seen        Abdominal Wall:         Previously seen  Nuchal Fold:           Previously seen        Cord Vessels:           Previously seen  Face:                  Orbits and profile     Kidneys:                Appear normal                         previously seen  Lips:                  Previously seen        Bladder:                Appears normal  Thoracic:              Appears normal         Spine:                  Previously seen  Heart:                 Echogenic focus        Upper Extremities:      Previously seen                         in LV  RVOT:                  Previously seen        Lower Extremities:  Previously seen  LVOT:                  Previously seen  Other:  Heels and 5th digit prev visualized.  Good fetal movement seeen. ---------------------------------------------------------------------- Doppler - Fetal Vessels  Umbilical Artery   S/D     %tile                                            ADFV    RDFV  3.24       58                                                No      No ---------------------------------------------------------------------- Impression   Fetal growth is appropriate for gestational age. The estimated  fetal weight is at the 18th percentile. Head-circumference  measurement is at between -2  Sto -1 SD (normal). Amniotic  fluid is normal and good fetal activity is seen. Her  previous  child weighed 5 lb 12 oz at term. ---------------------------------------------------------------------- Recommendations  An appointment was made for her to return in 6 weeks for  fetal growth assessment. ----------------------------------------------------------------------                  Noralee Space, MD Electronically Signed Final Report   12/06/2018 11:29 am ----------------------------------------------------------------------   MAU Course  Procedures  MDM -syncope evaluation       -UA: WNL per lab, per visual inspection by provider, urine was dark amber color       -CBC: H/H 10.1/31.4, otherwise WNL       -CMP: no abnormalities requiring treatment       -CBG: 76       -Orthostatic VS: unable to be completed d/t near-syncopal episode on standing       -EKG: normal, per discussion with cardiology @1226        -1L LR given, pt reports feeling better without seeing spots after administration.       -Orthostatics after LR: normal -r/o PTL       -possible single ctx on EFM tracing       -cervix long/posterior/internal os closed -EFM:       -baseline: 140       -variability: moderate       -accels: positive, 10x10       -decels: few variable       -TOCO: possible single ctx, irritability -results above reviewed with pt -message sent to [facility] to schedule OB appt with patient - needs 28wk labs -per pt request, sending RX with refill for partner for trichomonas/CT as pt/partner have not yet taken medication for +CT/trich on 10/08/2018 -pt discharged to home in stable condition  Assessment and Plan   1. Vasovagal syncope   2. [redacted] weeks gestation of pregnancy   3. NST (non-stress test) reactive   4. Chlamydia trachomatis infection in mother  during third trimester of pregnancy   5. Trichomonal vaginitis in pregnancy in third trimester    Allergies as of 12/26/2018      Reactions   Latex Swelling   Onion Swelling      Medication List    STOP taking these medications   prenatal multivitamin Tabs tablet     TAKE  these medications   azithromycin 500 MG tablet Commonly known as:  Zithromax Take 2 tablets (1,000 mg total) by mouth once for 1 dose.   Comfort Fit Maternity Supp Med Misc 1 Device by Does not apply route daily.   Concept OB 130-92.4-1 MG Caps Take 1 capsule by mouth daily.   ferrous sulfate 325 (65 FE) MG tablet Take 1 tablet by mouth daily.   metroNIDAZOLE 500 MG tablet Commonly known as:  FLAGYL Take 4 tablets (2,000 mg total) by mouth once for 1 dose. What changed:    how much to take  when to take this  Another medication with the same name was removed. Continue taking this medication, and follow the directions you see here.   ondansetron 8 MG disintegrating tablet Commonly known as:  ZOFRAN-ODT Take 1 tablet (8 mg total) by mouth every 8 (eight) hours as needed for nausea or vomiting.   terconazole 0.8 % vaginal cream Commonly known as:  TERAZOL 3 Place 1 applicator vaginally at bedtime. Apply nightly for three nights.       -please call the [facility] office this week to schedule your next OB appt -RX for trich/CT sent to pharmacy with refill for partner -please take the medication today and do not have intercourse from today until 7days after you and your partner have both taken the medication -all partners in the last 60days should be tested and treated -PTL/MAU return precautions given -pt discharged to home in stable condition  Joni Reining E Champayne Kocian 12/26/2018, 4:35 PM

## 2018-12-26 NOTE — MAU Note (Signed)
Fainted last night, landed on the bed. Would have come last night, but didn't have anyone to watch her kids.  Has been feeling weak. When she stood up, she saw spots.  Has been going on for about a wk. Trying to drink a lot of water, seems like when she does- she throws up.  No appetite.  Has been having really really bad contractions at night, not bad now. When she stands up she has some fluid leak, few drops- been happening about a wk.

## 2018-12-26 NOTE — Discharge Instructions (Signed)
Trichomoniasis Trichomoniasis is an STI (sexually transmitted infection) that can affect both women and men. In women, the outer area of the female genitalia (vulva) and the vagina are affected. In men, the penis is mainly affected, but the prostate and other reproductive organs can also be involved. This condition can be treated with medicine. It often has no symptoms (is asymptomatic), especially in men. What are the causes? This condition is caused by an organism called Trichomonas vaginalis. Trichomoniasis most often spreads from person to person (is contagious) through sexual contact. What increases the risk? The following factors may make you more likely to develop this condition:  Having unprotected sexual intercourse.  Having sexual intercourse with a partner who has trichomoniasis.  Having multiple sexual partners.  Having had previous trichomoniasis infections or other STIs. What are the signs or symptoms? In women, symptoms of trichomoniasis include:  Abnormal vaginal discharge that is clear, white, gray, or yellow-green and foamy and has an unusual "fishy" odor.  Itching and irritation of the vagina and vulva.  Burning or pain during urination or sexual intercourse.  Genital redness and swelling. In men, symptoms of trichomoniasis include:  Penile discharge that may be foamy or contain pus.  Pain in the penis. This may happen only when urinating.  Itching or irritation inside the penis.  Burning after urination or ejaculation. How is this diagnosed? In women, this condition may be found during a routine Pap test or physical exam. It may be found in men during a routine physical exam. Your health care provider may perform tests to help diagnose this infection, such as:  Urine tests (men and women).  The following in women: ? Testing the pH of the vagina. ? A vaginal swab test that checks for the Trichomonas vaginalis organism. ? Testing vaginal secretions. Your  health care provider may test you for other STIs, including HIV (human immunodeficiency virus). How is this treated? This condition is treated with medicine taken by mouth (orally), such as metronidazole or tinidazole to fight the infection. Your sexual partner(s) may also need to be tested and treated.  If you are a woman and you plan to become pregnant or think you may be pregnant, tell your health care provider right away. Some medicines that are used to treat the infection should not be taken during pregnancy. Your health care provider may recommend over-the-counter medicines or creams to help relieve itching or irritation. You may be tested for infection again 3 months after treatment. Follow these instructions at home:  Take and use over-the-counter and prescription medicines, including creams, only as told by your health care provider.  Do not have sexual intercourse until one week after you finish your medicine, or until your health care provider approves. Ask your health care provider when you may resume sexual intercourse.  (Women) Do not douche or wear tampons while you have the infection.  Discuss your infection with your sexual partner(s). Make sure that your partner gets tested and treated, if necessary.  Keep all follow-up visits as told by your health care provider. This is important. How is this prevented?  Use condoms every time you have sex. Using condoms correctly and consistently can help protect against STIs.  Avoid having multiple sexual partners.  Talk with your sexual partner about any symptoms that either of you may have, as well as any history of STIs.  Get tested for STIs and STDs (sexually transmitted diseases) before you have sex. Ask your partner to do the same.  Do not have sexual contact if you have symptoms of trichomoniasis or another STI. Contact a health care provider if:  You still have symptoms after you finish your medicine.  You develop pain in  your abdomen.  You have pain when you urinate.  You have bleeding after sexual intercourse.  You develop a rash.  You feel nauseous or you vomit.  You plan to become pregnant or think you may be pregnant. Summary  Trichomoniasis is an STI (sexually transmitted infection) that can affect both women and men.  This condition often has no symptoms (is asymptomatic), especially in men.  You should not have sexual intercourse until one week after you finish your medicine, or until your health care provider approves. Ask your health care provider when you may resume sexual intercourse.  Discuss your infection with your sexual partner. Make sure that your partner gets tested and treated, if necessary. This information is not intended to replace advice given to you by your health care provider. Make sure you discuss any questions you have with your health care provider. Document Released: 02/28/2001 Document Revised: 07/28/2016 Document Reviewed: 07/28/2016 Elsevier Interactive Patient Education  2019 [employer] Chlamydia, Female Chlamydia is an STD (sexually transmitted disease). It is a bacterial infection that spreads (is contagious) through sexual contact. Chlamydia can occur in different areas of the body, including:  The tube that moves urine from the bladder out of the body (urethra).  The lower part of the uterus (cervix).  The throat.  The rectum. This condition is not difficult to treat. However, if left untreated, chlamydia can lead to more serious health problems, including pelvic inflammatory disorder (PID). PID can increase your risk of not being able to have children (sterility). Also, if chlamydia is left untreated and you are pregnant or become pregnant, there is a chance that your baby can become infected during delivery. This may cause serious health problems for the baby. What are the causes? Chlamydia is caused by the bacteria Chlamydia trachomatis. It is passed  from an infected partner during sexual activity. Chlamydia can spread through contact with the genitals, mouth, or rectum. What are the signs or symptoms? In some cases, there may not be any symptoms for this condition (asymptomatic), especially early in the infection. If symptoms develop, they may include:  Burning with urination.  Frequent urination.  Vaginal discharge.  Redness, soreness, and swelling (inflammation) of the rectum.  Bleeding or discharge from the rectum.  Abdominal pain.  Pain during sexual intercourse.  Bleeding between menstrual periods.  Itching, burning, or redness in the eyes, or discharge from the eyes. How is this diagnosed? This condition may be diagnosed with:  Urine tests.  Swab tests. Depending on your symptoms, your health care provider may use a cotton swab to collect discharge from your vagina or rectum to test for the bacteria.  A pelvic exam. How is this treated? This condition is treated with antibiotic medicines. If you are pregnant, certain types of antibiotics will need to be avoided. Follow these instructions at home: Medicines  Take over-the-counter and prescription medicines only as told by your health care provider.  Take your antibiotic medicine as told by your health care provider. Do not stop taking the antibiotic even if you start to feel better. Sexual activity  Tell sexual partners about your infection. This includes any oral, anal, or vaginal sex partners you have had within 60 days of when your symptoms started. Sexual partners should also be treated, even if  they have no signs of the disease.  Do not have sex until you and your sexual partners have completed treatment and your health care provider says it is okay. If your health care provider prescribed you a single dose treatment, wait 7 days after taking the treatment before having sex. General instructions  It is your responsibility to get your test results. Ask your  health care provider, or the department performing the test, when your results will be ready.  Get plenty of rest.  Eat a healthy, well-balanced diet.  Drink enough fluids to keep your urine clear or pale yellow.  Keep all follow-up visits as told by your health care provider. This is important. You may need to be tested for infection again 3 months after treatment. How is this prevented? The only sure way to prevent chlamydia is to avoid having sex. However, you can lower your risk by:  Using latex condoms correctly every time you have sex.  Not having multiple sexual partners.  Asking if your sexual partner has been tested for STIs and had negative results. Contact a health care provider if:  You develop new symptoms or your symptoms do not get better after completing treatment.  You have a fever or chills.  You have pain during sexual intercourse. Get help right away if:  Your pain gets worse and does not get better with medicine.  You develop flu-like symptoms, such as night sweats, sore throat, or muscle aches.  You experience nausea or vomiting.  You have difficulty swallowing.  You have bleeding between periods or after sex.  You have irregular menstrual periods.  You have abdominal or lower back pain that does not get better with medicine.  You feel weak or dizzy, or you faint.  You are pregnant and you develop symptoms of chlamydia. Summary  Chlamydia is an STD (sexually transmitted disease). It is a bacterial infection that spreads (is contagious) through sexual contact.  This condition is not difficult to treat, however. If left untreated, chlamydia can lead to more serious health problems, including pelvic inflammatory disease (PID).  In some cases, there may not be any symptoms for this condition (asymptomatic).  This condition is treated with antibiotic medicines.  Using latex condoms correctly every time you have sex can help prevent  chlamydia. This information is not intended to replace advice given to you by your health care provider. Make sure you discuss any questions you have with your health care provider. Document Released: 06/14/2005 Document Revised: 02/26/2018 Document Reviewed: 08/21/2016 Elsevier Interactive Patient Education  2019 [employer]. Preterm Labor and Birth Information Pregnancy normally lasts 39-41 weeks. Preterm labor is when labor starts early. It starts before you have been pregnant for 37 whole weeks. What are the risk factors for preterm labor? Preterm labor is more likely to occur in women who:  Have an infection while pregnant.  Have a cervix that is short.  Have gone into preterm labor before.  Have had surgery on their cervix.  Are younger than age 42.  Are older than age 90.  Are African American.  Are pregnant with two or more babies.  Take street drugs while pregnant.  Smoke while pregnant.  Do not gain enough weight while pregnant.  Got pregnant right after another pregnancy. What are the symptoms of preterm labor? Symptoms of preterm labor include:  Cramps. The cramps may feel like the cramps some women get during their period. The cramps may happen with watery poop (diarrhea).  Pain  in the belly (abdomen).  Pain in the lower back.  Regular contractions or tightening. It may feel like your belly is getting tighter.  Pressure in the lower belly that seems to get stronger.  More fluid (discharge) leaking from the vagina. The fluid may be watery or bloody.  Water breaking. Why is it important to notice signs of preterm labor? Babies who are born early may not be fully developed. They have a higher chance for:  Long-term heart problems.  Long-term lung problems.  Trouble controlling body systems, like breathing.  Bleeding in the brain.  A condition called cerebral palsy.  Learning difficulties.  Death. These risks are highest for babies who are  born before 34 weeks of pregnancy. How is preterm labor treated? Treatment depends on:  How long you were pregnant.  Your condition.  The health of your baby. Treatment may involve:  Having a stitch (suture) placed in your cervix. When you give birth, your cervix opens so the baby can come out. The stitch keeps the cervix from opening too soon.  Staying at the hospital.  Taking or getting medicines, such as: ? Hormone medicines. ? Medicines to stop contractions. ? Medicines to help the babys lungs develop. ? Medicines to prevent your baby from having cerebral palsy. What should I do if I am in preterm labor? If you think you are going into labor too soon, call your doctor right away. How can I prevent preterm labor?  Do not use any tobacco products. ? Examples of these are cigarettes, chewing tobacco, and e-cigarettes. ? If you need help quitting, ask your doctor.  Do not use street drugs.  Do not use any medicines unless you ask your doctor if they are safe for you.  Talk with your doctor before taking any herbal supplements.  Make sure you gain enough weight.  Watch for infection. If you think you might have an infection, get it checked right away.  If you have gone into preterm labor before, tell your doctor. This information is not intended to replace advice given to you by your health care provider. Make sure you discuss any questions you have with your health care provider. Document Released: 12/01/2008 Document Revised: 02/15/2016 Document Reviewed: 01/26/2016 Elsevier Interactive Patient Education  2019 [employer] Syncope Syncope is when you pass out (faint) for a short time. It is caused by a sudden decrease in blood flow to the brain. Signs that you may be about to pass out include:  Feeling dizzy or light-headed.  Feeling sick to your stomach (nauseous).  Seeing all white or all black.  Having cold, clammy skin. If you pass out, get help right away.  Call your local emergency services (911 in the [place] Do not drive yourself to the hospital. Follow these instructions at home: Watch for any changes in your symptoms. Take these actions to stay safe and help with your symptoms: Lifestyle  Do not drive, use machinery, or play sports until your doctor says it is okay.  Do not drink alcohol.  Do not use any products that contain nicotine or tobacco, such as cigarettes and e-cigarettes. If you need help quitting, ask your doctor.  Drink enough fluid to keep your pee (urine) pale yellow. General instructions  Take over-the-counter and prescription medicines only as told by your doctor.  If you are taking blood pressure or heart medicine, sit up and stand up slowly. Spend a few minutes getting ready to sit and then stand. This can help  you feel less dizzy.  Have someone stay with you until you feel stable.  If you start to feel like you might pass out, lie down right away and raise (elevate) your feet above the level of your heart. Breathe deeply and steadily. Wait until all of the symptoms are gone.  Keep all follow-up visits as told by your doctor. This is important. Get help right away if:  You have a very bad headache.  You pass out once or more than once.  You have pain in your chest, belly, or back.  You have a very fast or uneven heartbeat (palpitations).  It hurts to breathe.  You are bleeding from your mouth or your bottom (rectum).  You have black or tarry poop (stool).  You have jerky movements that you cannot control (seizure).  You are confused.  You have trouble walking.  You are very weak.  You have vision problems. These symptoms may be an emergency. Do not wait to see if the symptoms will go away. Get medical help right away. Call your local emergency services (911 in the [place] Do not drive yourself to the hospital. Summary  Syncope is when you pass out (faint) for a short time. It is caused by a sudden  decrease in blood flow to the brain.  Signs that you may be about to faint include feeling dizzy, light-headed, or sick to your stomach, seeing all white or all black, or having cold, clammy skin.  If you start to feel like you might pass out, lie down right away and raise (elevate) your feet above the level of your heart. Breathe deeply and steadily. Wait until all of the symptoms are gone. This information is not intended to replace advice given to you by your health care provider. Make sure you discuss any questions you have with your health care provider. Document Released: 02/21/2008 Document Revised: 10/17/2017 Document Reviewed: 10/17/2017 Elsevier Interactive Patient Education  2019 [employer]

## 2019-01-01 ENCOUNTER — Encounter: Payer: Self-pay | Admitting: Advanced Practice Midwife

## 2019-01-01 ENCOUNTER — Other Ambulatory Visit: Payer: Self-pay

## 2019-01-01 ENCOUNTER — Ambulatory Visit (INDEPENDENT_AMBULATORY_CARE_PROVIDER_SITE_OTHER): Payer: Medicaid Other | Admitting: Advanced Practice Midwife

## 2019-01-01 ENCOUNTER — Other Ambulatory Visit: Payer: Medicaid Other

## 2019-01-01 VITALS — BP 108/76 | HR 94 | Wt 140.4 lb

## 2019-01-01 DIAGNOSIS — A5901 Trichomonal vulvovaginitis: Secondary | ICD-10-CM

## 2019-01-01 DIAGNOSIS — O09213 Supervision of pregnancy with history of pre-term labor, third trimester: Secondary | ICD-10-CM

## 2019-01-01 DIAGNOSIS — O98313 Other infections with a predominantly sexual mode of transmission complicating pregnancy, third trimester: Secondary | ICD-10-CM

## 2019-01-01 DIAGNOSIS — Z23 Encounter for immunization: Secondary | ICD-10-CM

## 2019-01-01 DIAGNOSIS — O23593 Infection of other part of genital tract in pregnancy, third trimester: Secondary | ICD-10-CM

## 2019-01-01 DIAGNOSIS — O09899 Supervision of other high risk pregnancies, unspecified trimester: Secondary | ICD-10-CM

## 2019-01-01 DIAGNOSIS — O09219 Supervision of pregnancy with history of pre-term labor, unspecified trimester: Secondary | ICD-10-CM

## 2019-01-01 DIAGNOSIS — O0990 Supervision of high risk pregnancy, unspecified, unspecified trimester: Secondary | ICD-10-CM | POA: Diagnosis not present

## 2019-01-01 DIAGNOSIS — Z3A31 31 weeks gestation of pregnancy: Secondary | ICD-10-CM

## 2019-01-01 DIAGNOSIS — A568 Sexually transmitted chlamydial infection of other sites: Secondary | ICD-10-CM

## 2019-01-01 MED ORDER — FAMOTIDINE 20 MG PO TABS: 20.0000 mg | ORAL_TABLET | Freq: Two times a day (BID) | ORAL | 0 refills | Status: DC

## 2019-01-01 MED ORDER — METOCLOPRAMIDE HCL 10 MG PO TABS: 10.0000 mg | ORAL_TABLET | Freq: Four times a day (QID) | ORAL | 2 refills | Status: DC | PRN

## 2019-01-01 NOTE — Patient Instructions (Addendum)
Fetal Movement Counts  Patient Name: ________________________________________________ Patient Due Date: ____________________  What is a fetal movement count?    A fetal movement count is the number of times that you feel your baby move during a certain amount of time. This may also be called a fetal kick count. A fetal movement count is recommended for every pregnant woman. You may be asked to start counting fetal movements as early as week 28 of your pregnancy.  Pay attention to when your baby is most active. You may notice your baby's sleep and wake cycles. You may also notice things that make your baby move more. You should do a fetal movement count:   When your baby is normally most active.   At the same time each day.  A good time to count movements is while you are resting, after having something to eat and drink.  How do I count fetal movements?  1. Find a quiet, comfortable area. Sit, or lie down on your side.  2. Write down the date, the start time and stop time, and the number of movements that you felt between those two times. Take this information with you to your health care visits.  3. For 2 hours, count kicks, flutters, swishes, rolls, and jabs. You should feel at least 10 movements during 2 hours.  4. You may stop counting after you have felt 10 movements.  5. If you do not feel 10 movements in 2 hours, have something to eat and drink. Then, keep resting and counting for 1 hour. If you feel at least 4 movements during that hour, you may stop counting.  Contact a health care provider if:   You feel fewer than 4 movements in 2 hours.   Your baby is not moving like he or she usually does.  Date: ____________ Start time: ____________ Stop time: ____________ Movements: ____________  Date: ____________ Start time: ____________ Stop time: ____________ Movements: ____________  Date: ____________ Start time: ____________ Stop time: ____________ Movements: ____________  Date: ____________ Start time:  ____________ Stop time: ____________ Movements: ____________  Date: ____________ Start time: ____________ Stop time: ____________ Movements: ____________  Date: ____________ Start time: ____________ Stop time: ____________ Movements: ____________  Date: ____________ Start time: ____________ Stop time: ____________ Movements: ____________  Date: ____________ Start time: ____________ Stop time: ____________ Movements: ____________  Date: ____________ Start time: ____________ Stop time: ____________ Movements: ____________  This information is not intended to replace advice given to you by your health care provider. Make sure you discuss any questions you have with your health care provider.  Document Released: 10/04/2006 Document Revised: 05/03/2016 Document Reviewed: 10/14/2015  Elsevier Interactive Patient Education  2019 [employer]  Tdap Vaccine (Tetanus, Diphtheria and Pertussis): What You Need to Know  1. Why get vaccinated?  Tetanus, diphtheria and pertussis are very serious diseases. Tdap vaccine can protect us from these diseases. And, Tdap vaccine given to pregnant women can protect newborn babies against pertussis..  TETANUS (Lockjaw) is rare in the [place] today. It causes painful muscle tightening and stiffness, usually all over the body.   It can lead to tightening of muscles in the head and neck so you can't open your mouth, swallow, or sometimes even breathe. Tetanus kills about 1 out of 10 people who are infected even after receiving the best medical care.  DIPHTHERIA is also rare in the [place] today. It can cause a thick coating to form in the back of the throat.   It can   lead to breathing problems, heart failure, paralysis, and death.  PERTUSSIS (Whooping Cough) causes severe coughing spells, which can cause difficulty breathing, vomiting and disturbed sleep.   It can also lead to weight loss, incontinence, and rib fractures. Up to 2 in 100 adolescents and 5 in 100 adults with  pertussis are hospitalized or have complications, which could include pneumonia or death.  These diseases are caused by bacteria. Diphtheria and pertussis are spread from person to person through secretions from coughing or sneezing. Tetanus enters the body through cuts, scratches, or wounds.  Before vaccines, as many as 200,000 cases of diphtheria, 200,000 cases of pertussis, and hundreds of cases of tetanus, were reported in the [place] each year. Since vaccination began, reports of cases for tetanus and diphtheria have dropped by about 99% and for pertussis by about 80%.  2. Tdap vaccine  Tdap vaccine can protect adolescents and adults from tetanus, diphtheria, and pertussis. One dose of Tdap is routinely given at age 11 or 12. People who did not get Tdap at that age should get it as soon as possible.  Tdap is especially important for healthcare professionals and anyone having close contact with a baby younger than 12 months.  Pregnant women should get a dose of Tdap during every pregnancy, to protect the newborn from pertussis. Infants are most at risk for severe, life-threatening complications from pertussis.  Another vaccine, called Td, protects against tetanus and diphtheria, but not pertussis. A Td booster should be given every 10 years. Tdap may be given as one of these boosters if you have never gotten Tdap before. Tdap may also be given after a severe cut or burn to prevent tetanus infection.  Your doctor or the person giving you the vaccine can give you more information.  Tdap may safely be given at the same time as other vaccines.  3. Some people should not get this vaccine   A person who has ever had a life-threatening allergic reaction after a previous dose of any diphtheria, tetanus or pertussis containing vaccine, OR has a severe allergy to any part of this vaccine, should not get Tdap vaccine. Tell the person giving the vaccine about any severe allergies.   Anyone who had coma or long  repeated seizures within 7 days after a childhood dose of DTP or DTaP, or a previous dose of Tdap, should not get Tdap, unless a cause other than the vaccine was found. They can still get Td.   Talk to your doctor if you:  ? have seizures or another nervous system problem,  ? had severe pain or swelling after any vaccine containing diphtheria, tetanus or pertussis,  ? ever had a condition called Guillain-Barr Syndrome (GBS),  ? aren't feeling well on the day the shot is scheduled.  4. Risks  With any medicine, including vaccines, there is a chance of side effects. These are usually mild and go away on their own. Serious reactions are also possible but are rare.  Most people who get Tdap vaccine do not have any problems with it.  Mild problems following Tdap  (Did not interfere with activities)   Pain where the shot was given (about 3 in 4 adolescents or 2 in 3 adults)   Redness or swelling where the shot was given (about 1 person in 5)   Mild fever of at least 100.4F (up to about 1 in 25 adolescents or 1 in 100 adults)   Headache (about 3 or 4 people in 10)     Tiredness (about 1 person in 3 or 4)   Nausea, vomiting, diarrhea, stomach ache (up to 1 in 4 adolescents or 1 in 10 adults)   Chills, sore joints (about 1 person in 10)   Body aches (about 1 person in 3 or 4)   Rash, swollen glands (uncommon)  Moderate problems following Tdap  (Interfered with activities, but did not require medical attention)   Pain where the shot was given (up to 1 in 5 or 6)   Redness or swelling where the shot was given (up to about 1 in 16 adolescents or 1 in 12 adults)   Fever over 102F (about 1 in 100 adolescents or 1 in 250 adults)   Headache (about 1 in 7 adolescents or 1 in 10 adults)   Nausea, vomiting, diarrhea, stomach ache (up to 1 or 3 people in 100)   Swelling of the entire arm where the shot was given (up to about 1 in 500).  Severe problems following Tdap  (Unable to perform usual activities; required  medical attention)   Swelling, severe pain, bleeding and redness in the arm where the shot was given (rare).  Problems that could happen after any vaccine:   People sometimes faint after a medical procedure, including vaccination. Sitting or lying down for about 15 minutes can help prevent fainting, and injuries caused by a fall. Tell your doctor if you feel dizzy, or have vision changes or ringing in the ears.   Some people get severe pain in the shoulder and have difficulty moving the arm where a shot was given. This happens very rarely.   Any medication can cause a severe allergic reaction. Such reactions from a vaccine are very rare, estimated at fewer than 1 in a million doses, and would happen within a few minutes to a few hours after the vaccination.  As with any medicine, there is a very remote chance of a vaccine causing a serious injury or death.  The safety of vaccines is always being monitored. For more information, visit: www.cdc.gov/vaccinesafety/  5. What if there is a serious problem?  What should I look for?   Look for anything that concerns you, such as signs of a severe allergic reaction, very high fever, or unusual behavior.  Signs of a severe allergic reaction can include hives, swelling of the face and throat, difficulty breathing, a fast heartbeat, dizziness, and weakness. These would usually start a few minutes to a few hours after the vaccination.  What should I do?   If you think it is a severe allergic reaction or other emergency that can't wait, call 9-1-1 or get the person to the nearest hospital. Otherwise, call your doctor.   Afterward, the reaction should be reported to the Vaccine Adverse Event Reporting System (VAERS). Your doctor might file this report, or you can do it yourself through the VAERS web site at www.vaers.hhs.gov, or by calling 1-800-822-7967.  VAERS does not give medical advice.  6. The National Vaccine Injury Compensation Program  The National Vaccine Injury  Compensation Program (VICP) is a federal program that was created to compensate people who may have been injured by certain vaccines.  Persons who believe they may have been injured by a vaccine can learn about the program and about filing a claim by calling 1-800-338-2382 or visiting the VICP website at www.hrsa.gov/vaccinecompensation. There is a time limit to file a claim for compensation.  7. How can I learn more?   Ask your doctor. He or

## 2019-01-01 NOTE — Progress Notes (Addendum)
   PRENATAL VISIT NOTE  Subjective:  Mandy Vasquez is a 28 y.o. [id number] at [redacted]w[redacted]d being seen today for ongoing prenatal care.  She is currently monitored for the following issues for this high-risk pregnancy and has History of preterm delivery, currently pregnant; Supervision of high risk pregnancy, antepartum; Prenatal care insufficient; and Chlamydia infection affecting pregnancy in second trimester on their problem list.  Patient reports nausea and vomiting. She denies aggravating or alleviating factors. Patient believes she is prone to extreme dehydration and that is the reason for her syncopal episodes, for which she received care in [facility] 12/26/18. She denies additional episodes of syncope or near-syncope since that time.   Patient states her Chlamydia and Trichomonas medications were sent to the wrong pharmacy and she and her partner did not treat until 12/30/18. They treated simultaneously and are currently abstinent.  Contractions: Not present. Vag. Bleeding: None.  Movement: Present. Denies leaking of fluid.   The following portions of the patient's history were reviewed and updated as appropriate: allergies, current medications, past family history, past medical history, past social history, past surgical history and problem list. Problem list updated.  Objective:   Vitals:   01/01/19 0837  BP: 108/76  Pulse: 94  Weight: 63.7 kg    Fetal Status: Fetal Heart Rate (bpm): 144 Fundal Height: 32 cm Movement: Present     General:  Alert, oriented and cooperative. Patient is in no acute distress.  Skin: Skin is warm and dry. No rash noted.   Cardiovascular: Normal heart rate noted  Respiratory: Normal respiratory effort, no problems with respiration noted  Abdomen: Soft, gravid, appropriate for gestational age.  Pain/Pressure: Present     Pelvic: Cervical exam deferred        Extremities: Normal range of motion.  Edema: None  Mental Status: Normal mood and affect. Normal behavior.  Normal judgment and thought content.   Assessment and Plan:  Pregnancy: [id number] at [redacted]w[redacted]d  1. Supervision of high risk pregnancy, antepartum - Rx Reglan and Pepcid for recurrent, unpredictable episodes of nausea and vomiting - FH appropriate, weight up 0.544 kg from  03/20 - No BP cuffs available in clinic today. Added to [employer], will receive BP cuff in mail. Teaching completed - Tdap vaccine greater than or equal to 7yo IM - Glucose Tolerance, 2 Hours w/1 Hour - RPR - CBC - HIV antibody (with reflex)  2. History of preterm delivery, currently pregnant - Not taking 17-P  3. Chlamydia trachomatis infection in mother during third trimester of pregnancy - Not treated until 04/13 per patient  4. Trichomonal vaginitis in pregnancy in third trimester - Not treated until 04/13 per patient  Preterm labor symptoms and general obstetric precautions including but not limited to vaginal bleeding, contractions, leaking of fluid and fetal movement were reviewed in detail with the patient. Please refer to After Visit Summary for other counseling recommendations.  Return in about 5 weeks (around 02/05/2019) for 36 week, in person visit.  Plan for virtual visit at 38 weeks, in person visits at 39 and 40 weeks PRN   Future Appointments  Date Time Provider Department Center  01/20/2019 11:00 AM [facility] NURSE [facility]  01/20/2019 11:00 AM [facility] [place] 3 [facility]  02/06/2019  1:15 PM Sharyon Cable, CNM [facility] None    [place], [place]

## 2019-01-02 LAB — CBC
Hematocrit: 32.6 % — ABNORMAL LOW (ref 34.0–46.6)
Hemoglobin: 10.6 g/dL — ABNORMAL LOW (ref 11.1–15.9)
MCH: 28.4 pg (ref 26.6–33.0)
MCHC: 32.5 g/dL (ref 31.5–35.7)
MCV: 87 fL (ref 79–97)
Platelets: 219 10*3/uL (ref 150–450)
RBC: 3.73 x10E6/uL — ABNORMAL LOW (ref 3.77–5.28)
RDW: 12.4 % (ref 11.7–15.4)
WBC: 6 10*3/uL (ref 3.4–10.8)

## 2019-01-02 LAB — GLUCOSE TOLERANCE, 2 HOURS W/ 1HR
Glucose, 1 hour: 74 mg/dL (ref 65–179)
Glucose, 2 hour: 77 mg/dL (ref 65–152)
Glucose, Fasting: 64 mg/dL — ABNORMAL LOW (ref 65–91)

## 2019-01-02 LAB — RPR: RPR Ser Ql: NONREACTIVE

## 2019-01-02 LAB — HIV ANTIBODY (ROUTINE TESTING W REFLEX): HIV Screen 4th Generation wRfx: NONREACTIVE

## 2019-01-08 ENCOUNTER — Encounter: Payer: Self-pay | Admitting: Advanced Practice Midwife

## 2019-01-16 ENCOUNTER — Encounter: Payer: Self-pay | Admitting: Obstetrics

## 2019-01-20 ENCOUNTER — Encounter (HOSPITAL_COMMUNITY): Payer: Self-pay

## 2019-01-20 ENCOUNTER — Ambulatory Visit (HOSPITAL_COMMUNITY)
Admission: RE | Admit: 2019-01-20 | Discharge: 2019-01-20 | Disposition: A | Discharge status: Home / Self Care | Payer: Medicaid Other | Source: Ambulatory Visit | Attending: Obstetrics and Gynecology | Admitting: Obstetrics and Gynecology

## 2019-01-20 ENCOUNTER — Other Ambulatory Visit (HOSPITAL_COMMUNITY): Payer: Self-pay | Admitting: Obstetrics and Gynecology

## 2019-01-20 ENCOUNTER — Other Ambulatory Visit: Payer: Self-pay

## 2019-01-20 ENCOUNTER — Ambulatory Visit (HOSPITAL_COMMUNITY): Payer: Medicaid Other | Admitting: *Deleted

## 2019-01-20 VITALS — Temp 98.6°F

## 2019-01-20 DIAGNOSIS — Z3689 Encounter for other specified antenatal screening: Secondary | ICD-10-CM

## 2019-01-20 DIAGNOSIS — A749 Chlamydial infection, unspecified: Secondary | ICD-10-CM

## 2019-01-20 DIAGNOSIS — O0932 Supervision of pregnancy with insufficient antenatal care, second trimester: Secondary | ICD-10-CM | POA: Insufficient documentation

## 2019-01-20 DIAGNOSIS — O365930 Maternal care for other known or suspected poor fetal growth, third trimester, not applicable or unspecified: Secondary | ICD-10-CM

## 2019-01-20 DIAGNOSIS — Z362 Encounter for other antenatal screening follow-up: Secondary | ICD-10-CM | POA: Diagnosis not present

## 2019-01-20 DIAGNOSIS — O365990 Maternal care for other known or suspected poor fetal growth, unspecified trimester, not applicable or unspecified: Secondary | ICD-10-CM | POA: Insufficient documentation

## 2019-01-20 DIAGNOSIS — Z3A34 34 weeks gestation of pregnancy: Secondary | ICD-10-CM | POA: Diagnosis not present

## 2019-01-20 DIAGNOSIS — O0990 Supervision of high risk pregnancy, unspecified, unspecified trimester: Secondary | ICD-10-CM

## 2019-01-20 DIAGNOSIS — O359XX0 Maternal care for (suspected) fetal abnormality and damage, unspecified, not applicable or unspecified: Secondary | ICD-10-CM | POA: Diagnosis not present

## 2019-01-20 DIAGNOSIS — O0933 Supervision of pregnancy with insufficient antenatal care, third trimester: Secondary | ICD-10-CM | POA: Diagnosis not present

## 2019-01-20 DIAGNOSIS — O09213 Supervision of pregnancy with history of pre-term labor, third trimester: Secondary | ICD-10-CM | POA: Diagnosis not present

## 2019-01-20 DIAGNOSIS — O98812 Other maternal infectious and parasitic diseases complicating pregnancy, second trimester: Secondary | ICD-10-CM

## 2019-01-20 NOTE — Procedures (Signed)
Mandy Vasquez [birth year] [redacted]w[redacted]d  Fetus A Non-Stress Test Interpretation for 01/20/19  Indication: Unsatisfactory BPP  Fetal Heart Rate A Mode: External Baseline Rate (A): 140 bpm Variability: Moderate Accelerations: 15 x 15 Decelerations: None Multiple birth?: No  Uterine Activity Mode: Toco Contraction Frequency (min): none noted  Interpretation (Fetal Testing) Nonstress Test Interpretation: Reactive Comments: FHR tracing rev'd by Dr. Judeth Cornfield

## 2019-01-21 ENCOUNTER — Other Ambulatory Visit (HOSPITAL_COMMUNITY): Payer: Self-pay | Admitting: *Deleted

## 2019-01-21 DIAGNOSIS — O365930 Maternal care for other known or suspected poor fetal growth, third trimester, not applicable or unspecified: Secondary | ICD-10-CM

## 2019-01-27 ENCOUNTER — Ambulatory Visit (HOSPITAL_COMMUNITY): Payer: Medicaid Other | Admitting: *Deleted

## 2019-01-27 ENCOUNTER — Encounter (HOSPITAL_COMMUNITY): Payer: Self-pay | Admitting: *Deleted

## 2019-01-27 ENCOUNTER — Ambulatory Visit (HOSPITAL_COMMUNITY)
Admission: RE | Admit: 2019-01-27 | Discharge: 2019-01-27 | Disposition: A | Discharge status: Home / Self Care | Payer: Medicaid Other | Source: Ambulatory Visit | Attending: Obstetrics and Gynecology | Admitting: Obstetrics and Gynecology

## 2019-01-27 ENCOUNTER — Other Ambulatory Visit: Payer: Self-pay

## 2019-01-27 ENCOUNTER — Other Ambulatory Visit (HOSPITAL_COMMUNITY): Payer: Self-pay | Admitting: Obstetrics and Gynecology

## 2019-01-27 DIAGNOSIS — O0932 Supervision of pregnancy with insufficient antenatal care, second trimester: Secondary | ICD-10-CM | POA: Diagnosis not present

## 2019-01-27 DIAGNOSIS — O365930 Maternal care for other known or suspected poor fetal growth, third trimester, not applicable or unspecified: Secondary | ICD-10-CM | POA: Insufficient documentation

## 2019-01-27 DIAGNOSIS — O09213 Supervision of pregnancy with history of pre-term labor, third trimester: Secondary | ICD-10-CM

## 2019-01-27 DIAGNOSIS — Z3A35 35 weeks gestation of pregnancy: Secondary | ICD-10-CM

## 2019-01-27 DIAGNOSIS — O98812 Other maternal infectious and parasitic diseases complicating pregnancy, second trimester: Secondary | ICD-10-CM | POA: Insufficient documentation

## 2019-01-27 DIAGNOSIS — A749 Chlamydial infection, unspecified: Secondary | ICD-10-CM | POA: Insufficient documentation

## 2019-01-27 DIAGNOSIS — O359XX0 Maternal care for (suspected) fetal abnormality and damage, unspecified, not applicable or unspecified: Secondary | ICD-10-CM | POA: Diagnosis not present

## 2019-01-27 DIAGNOSIS — O0990 Supervision of high risk pregnancy, unspecified, unspecified trimester: Secondary | ICD-10-CM | POA: Diagnosis not present

## 2019-01-27 DIAGNOSIS — O0933 Supervision of pregnancy with insufficient antenatal care, third trimester: Secondary | ICD-10-CM

## 2019-01-27 NOTE — Procedures (Signed)
Mandy Vasquez [birth year] [redacted]w[redacted]d  Fetus A Non-Stress Test Interpretation for 01/27/19  Indication: IUGR  Fetal Heart Rate A Mode: External Baseline Rate (A): 130 bpm Variability: Moderate Accelerations: 15 x 15 Decelerations: None Multiple birth?: No  Uterine Activity Mode: Toco Contraction Frequency (min): none noted  Interpretation (Fetal Testing) Nonstress Test Interpretation: Reactive Comments: FHR tracing rev'd by Dr. Judeth Cornfield

## 2019-01-29 ENCOUNTER — Encounter: Payer: Self-pay | Admitting: Advanced Practice Midwife

## 2019-02-03 ENCOUNTER — Ambulatory Visit (HOSPITAL_COMMUNITY)
Admission: RE | Admit: 2019-02-03 | Discharge: 2019-02-03 | Disposition: A | Discharge status: Home / Self Care | Payer: Medicaid Other | Source: Ambulatory Visit | Attending: Obstetrics and Gynecology | Admitting: Obstetrics and Gynecology

## 2019-02-03 ENCOUNTER — Ambulatory Visit (HOSPITAL_BASED_OUTPATIENT_CLINIC_OR_DEPARTMENT_OTHER): Payer: Medicaid Other | Admitting: *Deleted

## 2019-02-03 ENCOUNTER — Ambulatory Visit (HOSPITAL_COMMUNITY): Payer: Medicaid Other | Admitting: *Deleted

## 2019-02-03 ENCOUNTER — Other Ambulatory Visit (HOSPITAL_COMMUNITY): Payer: Self-pay | Admitting: *Deleted

## 2019-02-03 ENCOUNTER — Other Ambulatory Visit: Payer: Self-pay

## 2019-02-03 ENCOUNTER — Encounter (HOSPITAL_COMMUNITY): Payer: Self-pay | Admitting: *Deleted

## 2019-02-03 DIAGNOSIS — O365930 Maternal care for other known or suspected poor fetal growth, third trimester, not applicable or unspecified: Secondary | ICD-10-CM

## 2019-02-03 DIAGNOSIS — A749 Chlamydial infection, unspecified: Secondary | ICD-10-CM | POA: Diagnosis not present

## 2019-02-03 DIAGNOSIS — O09213 Supervision of pregnancy with history of pre-term labor, third trimester: Secondary | ICD-10-CM

## 2019-02-03 DIAGNOSIS — O98812 Other maternal infectious and parasitic diseases complicating pregnancy, second trimester: Secondary | ICD-10-CM | POA: Insufficient documentation

## 2019-02-03 DIAGNOSIS — O0933 Supervision of pregnancy with insufficient antenatal care, third trimester: Secondary | ICD-10-CM | POA: Diagnosis not present

## 2019-02-03 DIAGNOSIS — Z3A36 36 weeks gestation of pregnancy: Secondary | ICD-10-CM | POA: Diagnosis not present

## 2019-02-03 DIAGNOSIS — O0990 Supervision of high risk pregnancy, unspecified, unspecified trimester: Secondary | ICD-10-CM

## 2019-02-03 DIAGNOSIS — O359XX0 Maternal care for (suspected) fetal abnormality and damage, unspecified, not applicable or unspecified: Secondary | ICD-10-CM

## 2019-02-03 NOTE — Procedures (Signed)
Mandy Vasquez Feb 26, 1991 [redacted]w[redacted]d  Fetus A Non-Stress Test Interpretation for 02/03/19  Indication: IUGR  Fetal Heart Rate A Mode: External Baseline Rate (A): 140 bpm Variability: Moderate Accelerations: 15 x 15 Decelerations: None Multiple birth?: No  Uterine Activity Mode: Palpation, Toco Contraction Frequency (min): None Resting Tone Palpated: Relaxed Resting Time: Adequate  Interpretation (Fetal Testing) Nonstress Test Interpretation: Reactive Comments: EFM tracing reviewed by Dr. Judeth Cornfield

## 2019-02-06 ENCOUNTER — Other Ambulatory Visit (HOSPITAL_COMMUNITY)
Admission: RE | Admit: 2019-02-06 | Discharge: 2019-02-06 | Disposition: A | Discharge status: Home / Self Care | Payer: Medicaid Other | Source: Ambulatory Visit | Attending: Certified Nurse Midwife | Admitting: Certified Nurse Midwife

## 2019-02-06 ENCOUNTER — Ambulatory Visit (INDEPENDENT_AMBULATORY_CARE_PROVIDER_SITE_OTHER): Payer: Medicaid Other | Admitting: Obstetrics and Gynecology

## 2019-02-06 ENCOUNTER — Encounter: Payer: Self-pay | Admitting: Obstetrics and Gynecology

## 2019-02-06 ENCOUNTER — Other Ambulatory Visit: Payer: Self-pay

## 2019-02-06 VITALS — BP 107/69 | HR 85 | Temp 99.1°F | Wt 148.9 lb

## 2019-02-06 DIAGNOSIS — O09899 Supervision of other high risk pregnancies, unspecified trimester: Secondary | ICD-10-CM

## 2019-02-06 DIAGNOSIS — O0990 Supervision of high risk pregnancy, unspecified, unspecified trimester: Secondary | ICD-10-CM

## 2019-02-06 DIAGNOSIS — O98812 Other maternal infectious and parasitic diseases complicating pregnancy, second trimester: Secondary | ICD-10-CM

## 2019-02-06 DIAGNOSIS — O09219 Supervision of pregnancy with history of pre-term labor, unspecified trimester: Secondary | ICD-10-CM

## 2019-02-06 DIAGNOSIS — O98513 Other viral diseases complicating pregnancy, third trimester: Secondary | ICD-10-CM

## 2019-02-06 DIAGNOSIS — O365990 Maternal care for other known or suspected poor fetal growth, unspecified trimester, not applicable or unspecified: Secondary | ICD-10-CM

## 2019-02-06 DIAGNOSIS — A749 Chlamydial infection, unspecified: Secondary | ICD-10-CM

## 2019-02-06 NOTE — Patient Instructions (Signed)
Use the following websites (and others) to help learn more about your contraception options and find the method that is right for you!  - The Centers for Disease Control (CDC) website: https://www.cdc.gov/reproductivehealth/contraception/index.htm  - [facility] website: https://www.[facility]renthood.org/learn/birth-control  - Bedsider.org: https://www.bedsider.org/methods   Go to bedsider.org for more information!  Contraception Choices Contraception, also called birth control, refers to methods or devices that prevent pregnancy. Hormonal methods Contraceptive implant A contraceptive implant is a thin, plastic tube that contains a hormone. It is inserted into the upper part of the arm. It can remain in place for up to 3 years. Progestin-only injections Progestin-only injections are injections of progestin, a synthetic form of the hormone progesterone. They are given every 3 months by a health care provider. Birth control pills Birth control pills are pills that contain hormones that prevent pregnancy. They must be taken once a day, preferably at the same time each day. Birth control patch The birth control patch contains hormones that prevent pregnancy. It is placed on the skin and must be changed once a week for three weeks and removed on the fourth week. A prescription is needed to use this method of contraception. Vaginal ring A vaginal ring contains hormones that prevent pregnancy. It is placed in the vagina for three weeks and removed on the fourth week. After that, the process is repeated with a new ring. A prescription is needed to use this method of contraception. Emergency contraceptive Emergency contraceptives prevent pregnancy after unprotected sex. They come in pill form and can be taken up to 5 days after sex. They work best the sooner they are taken after having sex. Most emergency contraceptives are available without a prescription. This method should not be used as  your only form of birth control. Barrier methods Female condom A female condom is a thin sheath that is worn over the penis during sex. Condoms keep sperm from going inside a woman's body. They can be used with a spermicide to increase their effectiveness. They should be disposed after a single use. Female condom A female condom is a soft, loose-fitting sheath that is put into the vagina before sex. The condom keeps sperm from going inside a woman's body. They should be disposed after a single use.  Intrauterine contraception Intrauterine device (IUD) An IUD is a T-shaped device that is put in a woman's uterus. There are two types:  Hormone IUD.This type contains progestin, a synthetic form of the hormone progesterone. This type can stay in place for 3-5 years.  Copper IUD.This type is wrapped in copper wire. It can stay in place for 10 years.  Permanent methods of contraception Female tubal ligation In this method, a woman's fallopian tubes are sealed, tied, or blocked during surgery to prevent eggs from traveling to the uterus.  Female sterilization This is a procedure to tie off the tubes that carry sperm (vasectomy). After the procedure, the man can still ejaculate fluid (semen).  Summary  Contraception, also called birth control, means methods or devices that prevent pregnancy.  Hormonal methods of contraception include implants, injections, pills, patches, vaginal rings, and emergency contraceptives.  Barrier methods of contraception can include female condoms, female condoms, diaphragms, cervical caps, sponges, and spermicides.  There are two types of IUDs (intrauterine devices). An IUD can be put in a woman's uterus to prevent pregnancy for 3-5 years.  Permanent sterilization can be done through a procedure for males, females, or both. This information is not intended to replace advice given to you   by your health care provider. Make sure you discuss any questions you have with your  health care provider. Document Released: 09/04/2005 Document Revised: 10/07/2016 Document Reviewed: 10/07/2016 Elsevier Interactive Patient Education  2018 Elsevier Inc.  

## 2019-02-06 NOTE — Progress Notes (Signed)
Pt presents for ROB. GC/CT and GBS due today. Pt states she woke up this morning with a red "sore rash" L inner forearm area.

## 2019-02-06 NOTE — Progress Notes (Signed)
   PRENATAL VISIT NOTE  Subjective:  Mandy Vasquez is a 28 y.o. K3T4656 at [redacted]w[redacted]d being seen today for ongoing prenatal care.  She is currently monitored for the following issues for this high-risk pregnancy and has History of preterm delivery, currently pregnant; Supervision of high risk pregnancy, antepartum; Prenatal care insufficient; Chlamydia infection affecting pregnancy in second trimester; and Pregnancy affected by fetal growth restriction on their problem list.  Patient reports occasional contractions.  Contractions: Not present. Vag. Bleeding: None.  Movement: Present. Denies leaking of fluid.   The following portions of the patient's history were reviewed and updated as appropriate: allergies, current medications, past family history, past medical history, past social history, past surgical history and problem list.   Objective:   Vitals:   02/06/19 1402  BP: 107/69  Pulse: 85  Temp: 99.1 F (37.3 C)  Weight: 148 lb 14.4 oz (67.5 kg)    Fetal Status: Fetal Heart Rate (bpm): 148   Movement: Present     General:  Alert, oriented and cooperative. Patient is in no acute distress.  Skin: Skin is warm and dry. No rash noted.   Cardiovascular: Normal heart rate noted  Respiratory: Normal respiratory effort, no problems with respiration noted  Abdomen: Soft, gravid, appropriate for gestational age.  Pain/Pressure: Absent     Pelvic: Cervical exam deferred        Extremities: Normal range of motion.  Edema: None  Mental Status: Normal mood and affect. Normal behavior. Normal judgment and thought content.   Assessment and Plan:  Pregnancy: [id number] at [redacted]w[redacted]d  1. Supervision of high risk pregnancy, antepartum Reviewed options for contraception, she is considering PP IUD  2. Chlamydia infection affecting pregnancy in second trimester TOC today  3. History of preterm delivery, currently pregnant  4. Pregnancy affected by fetal growth restriction Cont weekly NSt/BPP/cord  dopplers For IOL 39 weeks per guidelines   Preterm labor symptoms and general obstetric precautions including but not limited to vaginal bleeding, contractions, leaking of fluid and fetal movement were reviewed in detail with the patient. Please refer to After Visit Summary for other counseling recommendations.   Return in about 1 week (around 02/13/2019) for OB visit (MD), virtual.  Future Appointments  Date Time Provider Department Center  02/12/2019 12:45 PM [facility] NURSE [facility]  02/12/2019 12:45 PM [facility] [place] 5 [facility]  02/12/2019  1:30 PM [facility] NST [facility]    Conan Bowens, MD

## 2019-02-07 LAB — CERVICOVAGINAL ANCILLARY ONLY
Bacterial vaginitis: NEGATIVE
Candida vaginitis: POSITIVE — AB
Chlamydia: POSITIVE — AB
Neisseria Gonorrhea: NEGATIVE
Trichomonas: NEGATIVE

## 2019-02-10 LAB — CULTURE, BETA STREP (GROUP B ONLY): Strep Gp B Culture: NEGATIVE

## 2019-02-12 ENCOUNTER — Ambulatory Visit (HOSPITAL_COMMUNITY)
Admission: RE | Admit: 2019-02-12 | Discharge: 2019-02-12 | Disposition: A | Discharge status: Home / Self Care | Payer: Medicaid Other | Source: Ambulatory Visit | Attending: Obstetrics and Gynecology | Admitting: Obstetrics and Gynecology

## 2019-02-12 ENCOUNTER — Other Ambulatory Visit (HOSPITAL_COMMUNITY): Payer: Self-pay | Admitting: *Deleted

## 2019-02-12 ENCOUNTER — Encounter (HOSPITAL_COMMUNITY): Payer: Self-pay

## 2019-02-12 ENCOUNTER — Ambulatory Visit (HOSPITAL_COMMUNITY): Payer: Medicaid Other | Admitting: *Deleted

## 2019-02-12 ENCOUNTER — Other Ambulatory Visit: Payer: Self-pay

## 2019-02-12 VITALS — Temp 97.6°F

## 2019-02-12 DIAGNOSIS — O09213 Supervision of pregnancy with history of pre-term labor, third trimester: Secondary | ICD-10-CM

## 2019-02-12 DIAGNOSIS — O98812 Other maternal infectious and parasitic diseases complicating pregnancy, second trimester: Secondary | ICD-10-CM | POA: Insufficient documentation

## 2019-02-12 DIAGNOSIS — O365990 Maternal care for other known or suspected poor fetal growth, unspecified trimester, not applicable or unspecified: Secondary | ICD-10-CM

## 2019-02-12 DIAGNOSIS — A749 Chlamydial infection, unspecified: Secondary | ICD-10-CM

## 2019-02-12 DIAGNOSIS — Z3A37 37 weeks gestation of pregnancy: Secondary | ICD-10-CM

## 2019-02-12 DIAGNOSIS — Z362 Encounter for other antenatal screening follow-up: Secondary | ICD-10-CM

## 2019-02-12 DIAGNOSIS — O365930 Maternal care for other known or suspected poor fetal growth, third trimester, not applicable or unspecified: Secondary | ICD-10-CM | POA: Diagnosis not present

## 2019-02-12 DIAGNOSIS — O0990 Supervision of high risk pregnancy, unspecified, unspecified trimester: Secondary | ICD-10-CM

## 2019-02-12 DIAGNOSIS — O359XX0 Maternal care for (suspected) fetal abnormality and damage, unspecified, not applicable or unspecified: Secondary | ICD-10-CM | POA: Diagnosis not present

## 2019-02-12 DIAGNOSIS — O0933 Supervision of pregnancy with insufficient antenatal care, third trimester: Secondary | ICD-10-CM | POA: Diagnosis not present

## 2019-02-12 MED ORDER — AZITHROMYCIN 500 MG PO TABS: 1000.0000 mg | ORAL_TABLET | Freq: Once | ORAL | 1 refills | Status: AC

## 2019-02-12 NOTE — Addendum Note (Signed)
Addended by: Leroy Libman on: 02/12/2019 04:27 PM   Modules accepted: Orders

## 2019-02-12 NOTE — Procedures (Signed)
Mandy Vasquez [birth year] [redacted]w[redacted]d  Fetus A Non-Stress Test Interpretation for 02/12/19  Indication: IUGR  Fetal Heart Rate A Mode: External Baseline Rate (A): 135 bpm Variability: Moderate Accelerations: 15 x 15 Decelerations: None Multiple birth?: No  Uterine Activity Mode: Toco Contraction Frequency (min): irreg UC noted Contraction Duration (sec): 60-90 Contraction Quality: Mild Resting Tone Palpated: Relaxed Resting Time: Adequate  Interpretation (Fetal Testing) Nonstress Test Interpretation: Reactive Comments: FHR tracing rev'd by Dr. Judeth Cornfield

## 2019-02-13 ENCOUNTER — Telehealth (INDEPENDENT_AMBULATORY_CARE_PROVIDER_SITE_OTHER): Payer: Medicaid Other | Admitting: Obstetrics

## 2019-02-13 ENCOUNTER — Encounter: Payer: Self-pay | Admitting: Obstetrics

## 2019-02-13 VITALS — BP 118/75 | HR 79

## 2019-02-13 DIAGNOSIS — O365930 Maternal care for other known or suspected poor fetal growth, third trimester, not applicable or unspecified: Secondary | ICD-10-CM | POA: Diagnosis not present

## 2019-02-13 DIAGNOSIS — O0993 Supervision of high risk pregnancy, unspecified, third trimester: Secondary | ICD-10-CM

## 2019-02-13 DIAGNOSIS — O0933 Supervision of pregnancy with insufficient antenatal care, third trimester: Secondary | ICD-10-CM | POA: Diagnosis not present

## 2019-02-13 DIAGNOSIS — Z3A37 37 weeks gestation of pregnancy: Secondary | ICD-10-CM

## 2019-02-13 DIAGNOSIS — O98819 Other maternal infectious and parasitic diseases complicating pregnancy, unspecified trimester: Secondary | ICD-10-CM

## 2019-02-13 DIAGNOSIS — O0930 Supervision of pregnancy with insufficient antenatal care, unspecified trimester: Secondary | ICD-10-CM

## 2019-02-13 DIAGNOSIS — O0990 Supervision of high risk pregnancy, unspecified, unspecified trimester: Secondary | ICD-10-CM

## 2019-02-13 DIAGNOSIS — A749 Chlamydial infection, unspecified: Secondary | ICD-10-CM

## 2019-02-13 DIAGNOSIS — O98813 Other maternal infectious and parasitic diseases complicating pregnancy, third trimester: Secondary | ICD-10-CM | POA: Diagnosis not present

## 2019-02-13 DIAGNOSIS — O365990 Maternal care for other known or suspected poor fetal growth, unspecified trimester, not applicable or unspecified: Secondary | ICD-10-CM

## 2019-02-13 NOTE — Progress Notes (Signed)
Pt presents for mychart visit. Pt identified with two patient identifiers. Pt is [redacted]w[redacted]d. Her bp today is 118/75. Pt has no other concerns.

## 2019-02-13 NOTE — Progress Notes (Signed)
TELEHEALTH OBSTETRICS PRENATAL VIRTUAL VIDEO VISIT ENCOUNTER NOTE  Provider location: [facility]   I connected with Mandy Vasquez on 02/13/19 at  3:00 PM EDT by WebEx OB MyChart Video Encounter at home and verified that I am speaking with the correct person using two identifiers.   I discussed the limitations, risks, security and privacy concerns of performing an evaluation and management service by telephone and the availability of in person appointments. I also discussed with the patient that there may be a patient responsible charge related to this service. The patient expressed understanding and agreed to proceed. Subjective:  Mandy Vasquez is a 28 y.o. [id number] at [redacted]w[redacted]d being seen today for ongoing prenatal care.  She is currently monitored for the following issues for this high-risk pregnancy and has History of preterm delivery, currently pregnant; Supervision of high risk pregnancy, antepartum; Prenatal care insufficient; Chlamydia infection affecting pregnancy in second trimester; and Pregnancy affected by fetal growth restriction on their problem list.  Patient reports no complaints.  Contractions: Irregular. Vag. Bleeding: None.  Movement: Present. Denies any leaking of fluid.   The following portions of the patient's history were reviewed and updated as appropriate: allergies, current medications, past family history, past medical history, past social history, past surgical history and problem list.   Objective:   Vitals:   02/13/19 1548  BP: 118/75  Pulse: 79    Fetal Status:     Movement: Present     General:  Alert, oriented and cooperative. Patient is in no acute distress.  Respiratory: Normal respiratory effort, no problems with respiration noted  Mental Status: Normal mood and affect. Normal behavior. Normal judgment and thought content.  Rest of physical exam deferred due to type of encounter  Imaging: [place] Mfm Fetal Bpp  W/nonstress  Result Date: 02/12/2019 ----------------------------------------------------------------------  OBSTETRICS REPORT                       (Signed Final 02/12/2019 02:46 pm) ---------------------------------------------------------------------- Patient Info  ID #:       [id number]                          D.O.B.:  12-14-1990 (28 yrs)  Name:       Mandy Vasquez                  Visit Date: 02/12/2019 01:14 pm ---------------------------------------------------------------------- Performed By  Performed By:     Earley Brooke     Ref. Address:     Faculty                    BS, RDMS  Attending:        Noralee Space MD        [facility]  Referred By:      Catalina Antigua MD ---------------------------------------------------------------------- Orders   #  Description  Code         Ordered By   1  [place] MFM UA CORD DOPPLER               [id number]     RAVI SHANKAR   2  [place] MFM FETAL BPP                     76818.5      RAVI SHANKAR      W/NONSTRESS   3  [place] MFM OB FOLLOW UP                  [id number]     RAVI SHANKAR  ----------------------------------------------------------------------   #  Order #                    Accession #                 Episode #   1  [id number]                  [id number]                  [id number]   2  [id number]                  [id number]                  [id number]   3  [id number]                  [id number]                  [id number]  ---------------------------------------------------------------------- Indications   Maternal care for known or suspected poor      O36.5930   fetal growth, third trimester, not applicable or   unspecified   [redacted] weeks gestation of pregnancy                Z3A.37   Poor obstetric history: Previous preterm       O09.219   delivery, antepartum (@34  weeks)   Insufficient Prenatal Care                     O09.30   Fetal  abnormality - other known or             O35.9XX0   suspected (EIFLV)  ---------------------------------------------------------------------- Vital Signs                                                 Height:        5'10" ---------------------------------------------------------------------- Fetal Evaluation  Num Of Fetuses:         1  Cardiac Activity:       Observed  Presentation:           Cephalic  Placenta:               Posterior  P. Cord Insertion:      Previously Visualized  Amniotic Fluid  AFI FV:      Within normal limits  AFI Sum(cm)     %Tile       Largest Pocket(cm)  18.4            71          4.6  RUQ(cm)  RLQ(cm)       LUQ(cm)        LLQ(cm)  4.6           4.6           4.6            4.6 ---------------------------------------------------------------------- Biophysical Evaluation  Amniotic F.V:   Within normal limits       F. Tone:        Observed  F. Movement:    Observed                   N.S.T:          Reactive  F. Breathing:   Observed                   Score:          10/10 ---------------------------------------------------------------------- Biometry  BPD:      85.3  mm     G. Age:  34w 2d          3  %    CI:        80.39   %    70 - 86                                                          FL/HC:      21.3   %    20.9 - 22.7  HC:      300.5  mm     G. Age:  33w 2d        < 3  %    HC/AC:      0.99        0.92 - 1.05  AC:      303.9  mm     G. Age:  34w 3d        < 3  %    FL/BPD:     74.9   %    71 - 87  FL:       63.9  mm     G. Age:  33w 0d        < 3  %    FL/AC:      21.0   %    20 - 24  HUM:      57.2  mm     G. Age:  33w 1d        < 5  %  Est. FW:    2295  gm      5 lb 1 oz   < 10  % ---------------------------------------------------------------------- OB History  Gravidity:    6         Term:   3        Prem:   1        SAB:   2  TOP:          0       Ectopic:  0        Living: 3 ---------------------------------------------------------------------- Gestational Age  LMP:            37w 4d        Date:  05/25/18                 EDD:   03/01/19  U/S Today:     33w 5d                                        EDD:   03/28/19  Best:          37w 4d     Det. By:  LMP  (05/25/18)          EDD:   03/01/19 ---------------------------------------------------------------------- Anatomy  Cranium:               Appears normal         Aortic Arch:            Previously seen  Cavum:                 Previously seen        Ductal Arch:            Previously seen  Ventricles:            Previously seen        Diaphragm:              Previously seen  Choroid Plexus:        Previously seen        Stomach:                Appears normal, left                                                                        sided  Cerebellum:            Previously seen        Abdomen:                Appears normal  Posterior Fossa:       Previously seen        Abdominal Wall:         Previously seen  Nuchal Fold:           Previously seen        Cord Vessels:           Previously seen  Face:                  Orbits and profile     Kidneys:                Appear normal                         previously seen  Lips:                  Previously seen        Bladder:                Appears normal  Thoracic:              Appears normal         Spine:                  Previously seen  Heart:  Appears normal         Upper Extremities:      Previously seen                         (4CH, axis, and                         situs)  RVOT:                  Previously seen        Lower Extremities:      Previously seen  LVOT:                  Previously seen  Other:  Heels and 5th digit prev visualized. Technically difficult due to fetal          position. ---------------------------------------------------------------------- Doppler - Fetal Vessels  Umbilical Artery   S/D     %tile  2.65       67 ---------------------------------------------------------------------- Impression  The estimated fetal weight is at less than  the 10th percentile,  but good interval growth is seen. Amniotic fluid is normal and  good fetal activity is seen. Antenatal testing is reassuring.  Umbilical artery Doppler showed normal forward diastolic  flow. NST is reactive. BPP 10/10.  We discussed timing of delivery. It is reasonable to deliver  between 38 and 40 weeks. I recommended 39 weeks. ---------------------------------------------------------------------- Recommendations  -Antenatal testing next week. ----------------------------------------------------------------------                  Noralee Space, MD Electronically Signed Final Report   02/12/2019 02:46 pm ----------------------------------------------------------------------  [place] Mfm Fetal Bpp W/nonstress  Result Date: 02/03/2019 ----------------------------------------------------------------------  OBSTETRICS REPORT                       (Signed Final 02/03/2019 03:38 pm) ---------------------------------------------------------------------- Patient Info  ID #:       [id number]                          D.O.B.:  [birth year] (27 yrs)  Name:       Mandy Vasquez                  Visit Date: 02/03/2019 02:05 pm ---------------------------------------------------------------------- Performed By  Performed By:     Percell Boston          Ref. Address:     Faculty                    RDMS  Attending:        Noralee Space MD        [facility]  Referred By:      Catalina Antigua MD ---------------------------------------------------------------------- Orders   #  Description  Code         Ordered By   1  [place] MFM UA CORD DOPPLER               [id number]     RAVI SHANKAR   2  [place] MFM FETAL BPP                     16109.6      RAVI [facility]      W/NONSTRESS  ----------------------------------------------------------------------   #  Order #                    Accession #                  Episode #   1  [id number]                  [id number]                  [id number]   2  [id number]                  [id number]                  [id number]  ---------------------------------------------------------------------- Indications   [redacted] weeks gestation of pregnancy                Z3A.36   Maternal care for known or suspected poor      O36.5930   fetal growth, third trimester, not applicable or   unspecified   Poor obstetric history: Previous preterm       O09.219   delivery, antepartum (@34  weeks)   Insufficient Prenatal Care                     O09.30   Fetal abnormality - other known or             O35.9XX0   suspected (EIFLV)  ---------------------------------------------------------------------- Vital Signs                                                 Height:        5'10" ---------------------------------------------------------------------- Fetal Evaluation  Num Of Fetuses:         1  Cardiac Activity:       Observed  Presentation:           Cephalic  Amniotic Fluid  AFI FV:      Within normal limits  AFI Sum(cm)     %Tile       Largest Pocket(cm)  11.03           30          3.78  RUQ(cm)       RLQ(cm)       LUQ(cm)        LLQ(cm)  1.7           3.23          3.78           2.32 ---------------------------------------------------------------------- Biophysical Evaluation  Amniotic F.V:   Within normal limits       F. Tone:        Observed  F. Movement:    Observed  N.S.T:          Reactive  F. Breathing:   Observed                   Score:          10/10 ---------------------------------------------------------------------- OB History  Gravidity:    6         Term:   3        Prem:   1        SAB:   2  TOP:          0       Ectopic:  0        Living: 3 ---------------------------------------------------------------------- Gestational Age  LMP:           36w 2d        Date:  05/25/18                 EDD:   03/01/19  Best:          Stevie Kern 2d     Det. By:  LMP  (05/25/18)          EDD:    03/01/19 ---------------------------------------------------------------------- Doppler - Fetal Vessels  Umbilical Artery   S/D     %tile     RI              PI                     ADFV    RDFV  3.12       86   0.68             1.04                         N       N ---------------------------------------------------------------------- Cervix Uterus Adnexa  Cervix  Not visualized (advanced GA >24wks) ---------------------------------------------------------------------- Impression  Fetal growth restriction.  Amniotic fluid is normal and good fetal activity is seen.  Antenatal testing is reassuring. Umbilical artery Doppler  showed normal forward diastolic flow. NST is reactive. BPP  10/10. ---------------------------------------------------------------------- Recommendations  -Fetal growth, BPP, NST and Doppler next week.  -To discuss timing of delivery. ----------------------------------------------------------------------                  Noralee Space, MD Electronically Signed Final Report   02/03/2019 03:38 pm ----------------------------------------------------------------------  [place] Mfm Fetal Bpp W/nonstress  Result Date: 01/27/2019 ----------------------------------------------------------------------  OBSTETRICS REPORT                       (Signed Final 01/27/2019 03:28 pm) ---------------------------------------------------------------------- Patient Info  ID #:       [id number]                          D.O.B.:  November 28, 1990 (27 yrs)  Name:       Mandy Vasquez                  Visit Date: 01/27/2019 01:06 pm ---------------------------------------------------------------------- Performed By  Performed By:     Magnus Ivan,         Ref. Address:     Faculty                    RDMS, RVT  Attending:        Noralee Space MD  [facility]  Referred By:      Catalina Antigua MD  ---------------------------------------------------------------------- Orders   #  Description                          Code         Ordered By   1  [place] MFM UA CORD DOPPLER               76820.02     RAVI SHANKAR   2  [place] MFM FETAL BPP                     82956.2      RAVI [facility]      W/NONSTRESS  ----------------------------------------------------------------------   #  Order #                    Accession #                 Episode #   1  [id number]                  [id number]                  [id number]   2  [id number]                  [id number]                  [id number]  ---------------------------------------------------------------------- Indications   Maternal care for known or suspected poor      O36.5930   fetal growth, third trimester, not applicable or   unspecified   Poor obstetric history: Previous preterm       O09.219   delivery, antepartum (@34  weeks)   Insufficient Prenatal Care                     O09.30   Fetal abnormality - other known or             O35.9XX0   suspected (EIFLV)   [redacted] weeks gestation of pregnancy                Z3A.35  ---------------------------------------------------------------------- Vital Signs                                                 Height:        5'10" ---------------------------------------------------------------------- Fetal Evaluation  Num Of Fetuses:         1  Fetal Heart Rate(bpm):  141  Cardiac Activity:       Observed  Presentation:           Cephalic  Amniotic Fluid  AFI FV:      Within normal limits  AFI Sum(cm)     %  Tile       Largest Pocket(cm)  11.57           32          4.71  RUQ(cm)       RLQ(cm)       LUQ(cm)        LLQ(cm)  1.72          2.86          4.71           2.28 ---------------------------------------------------------------------- Biophysical Evaluation  Amniotic F.V:   Pocket => 2 cm two         F. Tone:        Observed                  planes  F. Movement:    Observed                   N.S.T:          Reactive  F. Breathing:   Observed                    Score:          10/10 ---------------------------------------------------------------------- OB History  Gravidity:    6         Term:   3        Prem:   1        SAB:   2  TOP:          0       Ectopic:  0        Living: 3 ---------------------------------------------------------------------- Gestational Age  LMP:           35w 2d        Date:  05/25/18                 EDD:   03/01/19  Best:          Consuello Closs 2d     Det. By:  LMP  (05/25/18)          EDD:   03/01/19 ---------------------------------------------------------------------- Doppler - Fetal Vessels  Umbilical Artery   S/D     %tile                                            ADFV    RDFV  3.48       93                                                No      No ---------------------------------------------------------------------- Impression  Amniotic fluid is normal and good fetal activity is seen.  Antenatal testing is reassuring. NST is reactive. BPP 10/10.  Umbilical artery Doppler showed normal forward diastolic  flow.  We reassured the patient of the findings. ---------------------------------------------------------------------- Recommendations  -Continue weekly BPP and Doppler till delivery. ----------------------------------------------------------------------                  Noralee Space, MD Electronically Signed Final Report   01/27/2019 03:28 pm ----------------------------------------------------------------------  [place] Mfm Fetal Bpp W/nonstress  Result Date: 01/20/2019 ----------------------------------------------------------------------  OBSTETRICS REPORT                       (  Signed Final 01/20/2019 01:10 pm) ---------------------------------------------------------------------- Patient Info  ID #:       [id number]                          D.O.B.:  Jul 25, 1991 (27 yrs)  Name:       Mandy Vasquez                  Visit Date: 01/20/2019 11:17 am ---------------------------------------------------------------------- Performed By   Performed By:     Tomma Lightning             Ref. Address:     Faculty                    RDMS,RVT  Attending:        Noralee Space MD        [facility]  Referred By:      Catalina Antigua MD ---------------------------------------------------------------------- Orders   #  Description                          Code         Ordered By   1  [place] MFM OB FOLLOW UP                  40981.19     RAVI SHANKAR   2  [place] MFM UA CORD DOPPLER               76820.02     RAVI SHANKAR   3  [place] MFM FETAL BPP                     14782.9      RAVI [facility]      W/NONSTRESS  ----------------------------------------------------------------------   #  Order #                    Accession #                 Episode #   1  [id number]                  [id number]                  [id number]   2  [id number]                  [id number]                  [id number]   3  [id number]                  [id number]                  [id number]  ---------------------------------------------------------------------- Indications   Maternal care for known or suspected poor      O36.5930   fetal growth, third trimester, not applicable or  unspecified   Encounter for other antenatal screening        Z36.2   follow-up   [redacted] weeks gestation of pregnancy                Z3A.34   Poor obstetric history: Previous preterm       O09.219   delivery, antepartum (@34  weeks)   Insufficient Prenatal Care                     O09.30   Fetal abnormality - other known or             O35.9XX0   suspected (EIFLV)  ---------------------------------------------------------------------- Vital Signs  Weight (lb): 140                               Height:        5'10"  BMI:         20.09 ---------------------------------------------------------------------- Fetal Evaluation  Num Of Fetuses:         1  Fetal Heart Rate(bpm):  142  Cardiac Activity:       Observed  Presentation:            Cephalic  Placenta:               Posterior  P. Cord Insertion:      Previously Visualized  Amniotic Fluid  AFI FV:      Within normal limits  AFI Sum(cm)     %Tile       Largest Pocket(cm)  10.33           22          3.97  RUQ(cm)       RLQ(cm)       LUQ(cm)        LLQ(cm)  2.94          1.92          1.5            3.97 ---------------------------------------------------------------------- Biophysical Evaluation  Amniotic F.V:   Within normal limits       F. Tone:        Observed  F. Movement:    Observed                   N.S.T:          Reactive  F. Breathing:   Not Observed               Score:          8/10 ---------------------------------------------------------------------- Biometry  BPD:      77.6  mm     G. Age:  31w 1d        < 1  %    CI:        75.69   %    70 - 86                                                          FL/HC:      20.9   %    19.4 - 21.8  HC:      282.8  mm     G. Age:  31w 0d        <  3  %    HC/AC:      1.13        0.96 - 1.11  AC:      251.2  mm     G. Age:  29w 2d        < 3  %    FL/BPD:     76.2   %    71 - 87  FL:       59.1  mm     G. Age:  30w 6d        < 3  %    FL/AC:      23.5   %    20 - 24  HUM:        53  mm     G. Age:  30w 6d        < 5  %  Est. FW:    1513  gm      3 lb 5 oz   < 10  % ---------------------------------------------------------------------- OB History  Gravidity:    6         Term:   3        Prem:   1        SAB:   2  TOP:          0       Ectopic:  0        Living: 3 ---------------------------------------------------------------------- Gestational Age  LMP:           34w 2d        Date:  05/25/18                 EDD:   03/01/19  U/S Today:     30w 4d                                        EDD:   03/27/19  Best:          34w 2d     Det. By:  LMP  (05/25/18)          EDD:   03/01/19 ---------------------------------------------------------------------- Anatomy  Cranium:               Appears normal         Aortic Arch:            Previously seen   Cavum:                 Previously seen        Ductal Arch:            Previously seen  Ventricles:            Previously seen        Diaphragm:              Appears normal  Choroid Plexus:        Previously seen        Stomach:                Appears normal, left  sided  Cerebellum:            Previously seen        Abdomen:                Appears normal  Posterior Fossa:       Previously seen        Abdominal Wall:         Previously seen  Nuchal Fold:           Previously seen        Cord Vessels:           Previously seen  Face:                  Orbits and profile     Kidneys:                Appear normal                         previously seen  Lips:                  Previously seen        Bladder:                Appears normal  Thoracic:              Appears normal         Spine:                  Previously seen  Heart:                 Appears normal         Upper Extremities:      Previously seen                         (4CH, axis, and                         situs)  RVOT:                  Previously seen        Lower Extremities:      Previously seen  LVOT:                  Appears normal  Other:  Heels and 5th digit prev visualized. ---------------------------------------------------------------------- Doppler - Fetal Vessels  Umbilical Artery   S/D     %tile                                            ADFV    RDFV  3.37       90                                                No      No ---------------------------------------------------------------------- Cervix Uterus Adnexa  Cervix  Not visualized (advanced GA >24wks) ---------------------------------------------------------------------- Impression  Patient returned for fetal growth assessment. Her previous  child weighed 5-12 at birth.  On ultrasound, the estimated fetal weight is at less than the  10th percentile. Head circumference measurement is at -  2  SD (normal). Amniotic fluid is  normal and good fetal activity is  seen. Fetal breathing movements did not meet the criteria of  BPP. Umbilical artery Doppler study is normal. NST is  reactive. BPP 8/10.  I explained the significance of findings that small fetus is  more-likely to be constitutional. However, we recommend  weekly antenatal testing till delivery. I explained the  components of BPP. ---------------------------------------------------------------------- Recommendations  -Weekly BPP, NST and umbilical artery Doppler till delivery.  -We will address timing of delivery on her next visit. ----------------------------------------------------------------------                  Noralee Space, MD Electronically Signed Final Report   01/20/2019 01:10 pm ----------------------------------------------------------------------  [place] Mfm Ob Follow Up  Result Date: 02/12/2019 ----------------------------------------------------------------------  OBSTETRICS REPORT                       (Signed Final 02/12/2019 02:46 pm) ---------------------------------------------------------------------- Patient Info  ID #:       [id number]                          D.O.B.:  01-07-1991 (28 yrs)  Name:       Mandy Vasquez                  Visit Date: 02/12/2019 01:14 pm ---------------------------------------------------------------------- Performed By  Performed By:     Earley Brooke     Ref. Address:     Faculty                    BS, RDMS  Attending:        Noralee Space MD        Location[facility]  Referred By:      Catalina Antigua MD ---------------------------------------------------------------------- Orders   #  Description                          Code         Ordered By   1  [place] MFM UA CORD DOPPLER               76820.02     RAVI SHANKAR   2  [place] MFM FETAL BPP                     14782.9      RAVI SHANKAR      W/NONSTRESS   3  [place] MFM OB FOLLOW UP                   56213.08     RAVI SHANKAR  ----------------------------------------------------------------------   #  Order #                    Accession #  Episode #   1  [id number]                  [id number]                  [id number]   2  [id number]                  [id number]                  [id number]   3  [id number]                  [id number]                  [id number]  ---------------------------------------------------------------------- Indications   Maternal care for known or suspected poor      O36.5930   fetal growth, third trimester, not applicable or   unspecified   [redacted] weeks gestation of pregnancy                Z3A.37   Poor obstetric history: Previous preterm       O09.219   delivery, antepartum (@34  weeks)   Insufficient Prenatal Care                     O09.30   Fetal abnormality - other known or             O35.9XX0   suspected (EIFLV)  ---------------------------------------------------------------------- Vital Signs                                                 Height:        5'10" ---------------------------------------------------------------------- Fetal Evaluation  Num Of Fetuses:         1  Cardiac Activity:       Observed  Presentation:           Cephalic  Placenta:               Posterior  P. Cord Insertion:      Previously Visualized  Amniotic Fluid  AFI FV:      Within normal limits  AFI Sum(cm)     %Tile       Largest Pocket(cm)  18.4            71          4.6  RUQ(cm)       RLQ(cm)       LUQ(cm)        LLQ(cm)  4.6           4.6           4.6            4.6 ---------------------------------------------------------------------- Biophysical Evaluation  Amniotic F.V:   Within normal limits       F. Tone:        Observed  F. Movement:    Observed                   N.S.T:          Reactive  F. Breathing:   Observed                   Score:          10/10 ---------------------------------------------------------------------- Biometry  BPD:      85.3  mm     G. Age:  34w 2d           3  %    CI:        80.39   %    70 - 86                                                          FL/HC:      21.3   %    20.9 - 22.7  HC:      300.5  mm     G. Age:  33w 2d        < 3  %    HC/AC:      0.99        0.92 - 1.05  AC:      303.9  mm     G. Age:  34w 3d        < 3  %    FL/BPD:     74.9   %    71 - 87  FL:       63.9  mm     G. Age:  33w 0d        < 3  %    FL/AC:      21.0   %    20 - 24  HUM:      57.2  mm     G. Age:  33w 1d        < 5  %  Est. FW:    2295  gm      5 lb 1 oz   < 10  % ---------------------------------------------------------------------- OB History  Gravidity:    6         Term:   3        Prem:   1        SAB:   2  TOP:          0       Ectopic:  0        Living: 3 ---------------------------------------------------------------------- Gestational Age  LMP:           37w 4d        Date:  05/25/18                 EDD:   03/01/19  U/S Today:     33w 5d                                        EDD:   03/28/19  Best:          37w 4d     Det. By:  LMP  (05/25/18)          EDD:   03/01/19 ---------------------------------------------------------------------- Anatomy  Cranium:               Appears normal         Aortic Arch:            Previously seen  Cavum:                 Previously seen        Ductal Arch:  Previously seen  Ventricles:            Previously seen        Diaphragm:              Previously seen  Choroid Plexus:        Previously seen        Stomach:                Appears normal, left                                                                        sided  Cerebellum:            Previously seen        Abdomen:                Appears normal  Posterior Fossa:       Previously seen        Abdominal Wall:         Previously seen  Nuchal Fold:           Previously seen        Cord Vessels:           Previously seen  Face:                  Orbits and profile     Kidneys:                Appear normal                         previously seen  Lips:                   Previously seen        Bladder:                Appears normal  Thoracic:              Appears normal         Spine:                  Previously seen  Heart:                 Appears normal         Upper Extremities:      Previously seen                         (4CH, axis, and                         situs)  RVOT:                  Previously seen        Lower Extremities:      Previously seen  LVOT:                  Previously seen  Other:  Heels and 5th digit prev visualized. Technically difficult due to fetal          position. ---------------------------------------------------------------------- Doppler - Fetal Vessels  Umbilical Artery   S/D     %  tile  2.65       67 ---------------------------------------------------------------------- Impression  The estimated fetal weight is at less than the 10th percentile,  but good interval growth is seen. Amniotic fluid is normal and  good fetal activity is seen. Antenatal testing is reassuring.  Umbilical artery Doppler showed normal forward diastolic  flow. NST is reactive. BPP 10/10.  We discussed timing of delivery. It is reasonable to deliver  between 38 and 40 weeks. I recommended 39 weeks. ---------------------------------------------------------------------- Recommendations  -Antenatal testing next week. ----------------------------------------------------------------------                  Noralee Space, MD Electronically Signed Final Report   02/12/2019 02:46 pm ----------------------------------------------------------------------  [place] Mfm Ob Follow Up  Result Date: 01/20/2019 ----------------------------------------------------------------------  OBSTETRICS REPORT                       (Signed Final 01/20/2019 01:10 pm) ---------------------------------------------------------------------- Patient Info  ID #:       [id number]                          D.O.B.:  12/31/1990 (27 yrs)  Name:       Mandy Vasquez                  Visit Date: 01/20/2019 11:17 am  ---------------------------------------------------------------------- Performed By  Performed By:     Tomma Lightning             Ref. Address:     Faculty                    RDMS,RVT  Attending:        Noralee Space MD        [facility]  Referred By:      Catalina Antigua MD ---------------------------------------------------------------------- Orders   #  Description                          Code         Ordered By   1  [place] MFM OB FOLLOW UP                  13086.57     RAVI SHANKAR   2  [place] MFM UA CORD DOPPLER               76820.02     RAVI SHANKAR   3  [place] MFM FETAL BPP                     84696.2      RAVI SHANKAR      W/NONSTRESS  ----------------------------------------------------------------------   #  Order #                    Accession #  Episode #   1  [id number]                  [id number]                  [id number]   2  [id number]                  [id number]                  [id number]   3  [id number]                  [id number]                  [id number]  ---------------------------------------------------------------------- Indications   Maternal care for known or suspected poor      O36.5930   fetal growth, third trimester, not applicable or   unspecified   Encounter for other antenatal screening        Z36.2   follow-up   [redacted] weeks gestation of pregnancy                Z3A.34   Poor obstetric history: Previous preterm       O09.219   delivery, antepartum (@34  weeks)   Insufficient Prenatal Care                     O09.30   Fetal abnormality - other known or             O35.9XX0   suspected (EIFLV)  ---------------------------------------------------------------------- Vital Signs  Weight (lb): 140                               Height:        5'10"  BMI:         20.09 ---------------------------------------------------------------------- Fetal Evaluation  Num Of Fetuses:         1   Fetal Heart Rate(bpm):  142  Cardiac Activity:       Observed  Presentation:           Cephalic  Placenta:               Posterior  P. Cord Insertion:      Previously Visualized  Amniotic Fluid  AFI FV:      Within normal limits  AFI Sum(cm)     %Tile       Largest Pocket(cm)  10.33           22          3.97  RUQ(cm)       RLQ(cm)       LUQ(cm)        LLQ(cm)  2.94          1.92          1.5            3.97 ---------------------------------------------------------------------- Biophysical Evaluation  Amniotic F.V:   Within normal limits       F. Tone:        Observed  F. Movement:    Observed                   N.S.T:          Reactive  F. Breathing:   Not Observed               Score:  8/10 ---------------------------------------------------------------------- Biometry  BPD:      77.6  mm     G. Age:  31w 1d        < 1  %    CI:        75.69   %    70 - 86                                                          FL/HC:      20.9   %    19.4 - 21.8  HC:      282.8  mm     G. Age:  31w 0d        < 3  %    HC/AC:      1.13        0.96 - 1.11  AC:      251.2  mm     G. Age:  29w 2d        < 3  %    FL/BPD:     76.2   %    71 - 87  FL:       59.1  mm     G. Age:  30w 6d        < 3  %    FL/AC:      23.5   %    20 - 24  HUM:        53  mm     G. Age:  30w 6d        < 5  %  Est. FW:    1513  gm      3 lb 5 oz   < 10  % ---------------------------------------------------------------------- OB History  Gravidity:    6         Term:   3        Prem:   1        SAB:   2  TOP:          0       Ectopic:  0        Living: 3 ---------------------------------------------------------------------- Gestational Age  LMP:           34w 2d        Date:  05/25/18                 EDD:   03/01/19  U/S Today:     30w 4d                                        EDD:   03/27/19  Best:          34w 2d     Det. By:  LMP  (05/25/18)          EDD:   03/01/19 ---------------------------------------------------------------------- Anatomy   Cranium:               Appears normal         Aortic Arch:            Previously seen  Cavum:  Previously seen        Ductal Arch:            Previously seen  Ventricles:            Previously seen        Diaphragm:              Appears normal  Choroid Plexus:        Previously seen        Stomach:                Appears normal, left                                                                        sided  Cerebellum:            Previously seen        Abdomen:                Appears normal  Posterior Fossa:       Previously seen        Abdominal Wall:         Previously seen  Nuchal Fold:           Previously seen        Cord Vessels:           Previously seen  Face:                  Orbits and profile     Kidneys:                Appear normal                         previously seen  Lips:                  Previously seen        Bladder:                Appears normal  Thoracic:              Appears normal         Spine:                  Previously seen  Heart:                 Appears normal         Upper Extremities:      Previously seen                         (4CH, axis, and                         situs)  RVOT:                  Previously seen        Lower Extremities:      Previously seen  LVOT:                  Appears normal  Other:  Heels and 5th digit prev visualized. ---------------------------------------------------------------------- Doppler - Fetal Vessels  Umbilical Artery   S/D     %tile                                            ADFV    RDFV  3.37       90                                                No      No ---------------------------------------------------------------------- Cervix Uterus Adnexa  Cervix  Not visualized (advanced GA >24wks) ---------------------------------------------------------------------- Impression  Patient returned for fetal growth assessment. Her previous  child weighed 5-12 at birth.  On ultrasound, the estimated fetal weight is at less than the   10th percentile. Head circumference measurement is at -2  SD (normal). Amniotic fluid is normal and good fetal activity is  seen. Fetal breathing movements did not meet the criteria of  BPP. Umbilical artery Doppler study is normal. NST is  reactive. BPP 8/10.  I explained the significance of findings that small fetus is  more-likely to be constitutional. However, we recommend  weekly antenatal testing till delivery. I explained the  components of BPP. ---------------------------------------------------------------------- Recommendations  -Weekly BPP, NST and umbilical artery Doppler till delivery.  -We will address timing of delivery on her next visit. ----------------------------------------------------------------------                  Noralee Space, MD Electronically Signed Final Report   01/20/2019 01:10 pm ----------------------------------------------------------------------  [place] Mfm Ua Cord Doppler  Result Date: 02/12/2019 ----------------------------------------------------------------------  OBSTETRICS REPORT                       (Signed Final 02/12/2019 02:46 pm) ---------------------------------------------------------------------- Patient Info  ID #:       [id number]                          D.O.B.:  15-Sep-1991 (28 yrs)  Name:       Mandy Vasquez                  Visit Date: 02/12/2019 01:14 pm ---------------------------------------------------------------------- Performed By  Performed By:     Earley Brooke     Ref. Address:     Faculty                    BS, RDMS  Attending:        Noralee Space MD        [facility]  Referred By:      Catalina Antigua MD ---------------------------------------------------------------------- Orders   #  Description  Code         Ordered By   1  [place] MFM UA CORD DOPPLER               [id number]     RAVI SHANKAR   2  [place] MFM FETAL  BPP                     76818.5      RAVI SHANKAR      W/NONSTRESS   3  [place] MFM OB FOLLOW UP                  [id number]     RAVI SHANKAR  ----------------------------------------------------------------------   #  Order #                    Accession #                 Episode #   1  [id number]                  [id number]                  [id number]   2  [id number]                  [id number]                  [id number]   3  [id number]                  [id number]                  [id number]  ---------------------------------------------------------------------- Indications   Maternal care for known or suspected poor      O36.5930   fetal growth, third trimester, not applicable or   unspecified   [redacted] weeks gestation of pregnancy                Z3A.37   Poor obstetric history: Previous preterm       O09.219   delivery, antepartum (@34  weeks)   Insufficient Prenatal Care                     O09.30   Fetal abnormality - other known or             O35.9XX0   suspected (EIFLV)  ---------------------------------------------------------------------- Vital Signs                                                 Height:        5'10" ---------------------------------------------------------------------- Fetal Evaluation  Num Of Fetuses:         1  Cardiac Activity:       Observed  Presentation:           Cephalic  Placenta:               Posterior  P. Cord Insertion:      Previously Visualized  Amniotic Fluid  AFI FV:      Within normal limits  AFI Sum(cm)     %Tile       Largest Pocket(cm)  18.4            71          4.6  RUQ(cm)  RLQ(cm)       LUQ(cm)        LLQ(cm)  4.6           4.6           4.6            4.6 ---------------------------------------------------------------------- Biophysical Evaluation  Amniotic F.V:   Within normal limits       F. Tone:        Observed  F. Movement:    Observed                   N.S.T:          Reactive  F. Breathing:   Observed                   Score:          10/10  ---------------------------------------------------------------------- Biometry  BPD:      85.3  mm     G. Age:  34w 2d          3  %    CI:        80.39   %    70 - 86                                                          FL/HC:      21.3   %    20.9 - 22.7  HC:      300.5  mm     G. Age:  33w 2d        < 3  %    HC/AC:      0.99        0.92 - 1.05  AC:      303.9  mm     G. Age:  34w 3d        < 3  %    FL/BPD:     74.9   %    71 - 87  FL:       63.9  mm     G. Age:  33w 0d        < 3  %    FL/AC:      21.0   %    20 - 24  HUM:      57.2  mm     G. Age:  33w 1d        < 5  %  Est. FW:    2295  gm      5 lb 1 oz   < 10  % ---------------------------------------------------------------------- OB History  Gravidity:    6         Term:   3        Prem:   1        SAB:   2  TOP:          0       Ectopic:  0        Living: 3 ---------------------------------------------------------------------- Gestational Age  LMP:           37w 4d        Date:  05/25/18                 EDD:   03/01/19  U/S Today:     33w 5d                                        EDD:   03/28/19  Best:          37w 4d     Det. By:  LMP  (05/25/18)          EDD:   03/01/19 ---------------------------------------------------------------------- Anatomy  Cranium:               Appears normal         Aortic Arch:            Previously seen  Cavum:                 Previously seen        Ductal Arch:            Previously seen  Ventricles:            Previously seen        Diaphragm:              Previously seen  Choroid Plexus:        Previously seen        Stomach:                Appears normal, left                                                                        sided  Cerebellum:            Previously seen        Abdomen:                Appears normal  Posterior Fossa:       Previously seen        Abdominal Wall:         Previously seen  Nuchal Fold:           Previously seen        Cord Vessels:           Previously seen  Face:                   Orbits and profile     Kidneys:                Appear normal                         previously seen  Lips:                  Previously seen        Bladder:                Appears normal  Thoracic:              Appears normal         Spine:                  Previously seen  Heart:  Appears normal         Upper Extremities:      Previously seen                         (4CH, axis, and                         situs)  RVOT:                  Previously seen        Lower Extremities:      Previously seen  LVOT:                  Previously seen  Other:  Heels and 5th digit prev visualized. Technically difficult due to fetal          position. ---------------------------------------------------------------------- Doppler - Fetal Vessels  Umbilical Artery   S/D     %tile  2.65       67 ---------------------------------------------------------------------- Impression  The estimated fetal weight is at less than the 10th percentile,  but good interval growth is seen. Amniotic fluid is normal and  good fetal activity is seen. Antenatal testing is reassuring.  Umbilical artery Doppler showed normal forward diastolic  flow. NST is reactive. BPP 10/10.  We discussed timing of delivery. It is reasonable to deliver  between 38 and 40 weeks. I recommended 39 weeks. ---------------------------------------------------------------------- Recommendations  -Antenatal testing next week. ----------------------------------------------------------------------                  Noralee Space, MD Electronically Signed Final Report   02/12/2019 02:46 pm ----------------------------------------------------------------------  [place] Mfm Ua Cord Doppler  Result Date: 02/03/2019 ----------------------------------------------------------------------  OBSTETRICS REPORT                       (Signed Final 02/03/2019 03:38 pm) ---------------------------------------------------------------------- Patient Info  ID #:       [id number]                           D.O.B.:  June 10, 1991 (27 yrs)  Name:       Mandy Vasquez                  Visit Date: 02/03/2019 02:05 pm ---------------------------------------------------------------------- Performed By  Performed By:     Percell Boston          Ref. Address:     Faculty                    RDMS  Attending:        Noralee Space MD        [facility]  Referred By:      Catalina Antigua MD ---------------------------------------------------------------------- Orders   #  Description  Code         Ordered By   1  [place] MFM UA CORD DOPPLER               [id number]     RAVI SHANKAR   2  [place] MFM FETAL BPP                     16109.6      RAVI [facility]      W/NONSTRESS  ----------------------------------------------------------------------   #  Order #                    Accession #                 Episode #   1  [id number]                  [id number]                  [id number]   2  [id number]                  [id number]                  [id number]  ---------------------------------------------------------------------- Indications   [redacted] weeks gestation of pregnancy                Z3A.36   Maternal care for known or suspected poor      O36.5930   fetal growth, third trimester, not applicable or   unspecified   Poor obstetric history: Previous preterm       O09.219   delivery, antepartum (@34  weeks)   Insufficient Prenatal Care                     O09.30   Fetal abnormality - other known or             O35.9XX0   suspected (EIFLV)  ---------------------------------------------------------------------- Vital Signs                                                 Height:        5'10" ---------------------------------------------------------------------- Fetal Evaluation  Num Of Fetuses:         1  Cardiac Activity:       Observed  Presentation:           Cephalic  Amniotic Fluid  AFI FV:      Within normal  limits  AFI Sum(cm)     %Tile       Largest Pocket(cm)  11.03           30          3.78  RUQ(cm)       RLQ(cm)       LUQ(cm)        LLQ(cm)  1.7           3.23          3.78           2.32 ---------------------------------------------------------------------- Biophysical Evaluation  Amniotic F.V:   Within normal limits       F. Tone:        Observed  F. Movement:    Observed  N.S.T:          Reactive  F. Breathing:   Observed                   Score:          10/10 ---------------------------------------------------------------------- OB History  Gravidity:    6         Term:   3        Prem:   1        SAB:   2  TOP:          0       Ectopic:  0        Living: 3 ---------------------------------------------------------------------- Gestational Age  LMP:           36w 2d        Date:  05/25/18                 EDD:   03/01/19  Best:          Stevie Kern 2d     Det. By:  LMP  (05/25/18)          EDD:   03/01/19 ---------------------------------------------------------------------- Doppler - Fetal Vessels  Umbilical Artery   S/D     %tile     RI              PI                     ADFV    RDFV  3.12       86   0.68             1.04                         N       N ---------------------------------------------------------------------- Cervix Uterus Adnexa  Cervix  Not visualized (advanced GA >24wks) ---------------------------------------------------------------------- Impression  Fetal growth restriction.  Amniotic fluid is normal and good fetal activity is seen.  Antenatal testing is reassuring. Umbilical artery Doppler  showed normal forward diastolic flow. NST is reactive. BPP  10/10. ---------------------------------------------------------------------- Recommendations  -Fetal growth, BPP, NST and Doppler next week.  -To discuss timing of delivery. ----------------------------------------------------------------------                  Noralee Space, MD Electronically Signed Final Report   02/03/2019 03:38  pm ----------------------------------------------------------------------  [place] Mfm Ua Cord Doppler  Result Date: 01/27/2019 ----------------------------------------------------------------------  OBSTETRICS REPORT                       (Signed Final 01/27/2019 03:28 pm) ---------------------------------------------------------------------- Patient Info  ID #:       [id number]                          D.O.B.:  04-Aug-1991 (27 yrs)  Name:       Mandy Vasquez                  Visit Date: 01/27/2019 01:06 pm ---------------------------------------------------------------------- Performed By  Performed By:     Magnus Ivan,         Ref. Address:     Faculty                    RDMS, RVT  Attending:        Noralee Space MD  [facility]  Referred By:      Catalina Antigua MD ---------------------------------------------------------------------- Orders   #  Description                          Code         Ordered By   1  [place] MFM UA CORD DOPPLER               76820.02     RAVI SHANKAR   2  [place] MFM FETAL BPP                     16109.6      RAVI [facility]      W/NONSTRESS  ----------------------------------------------------------------------   #  Order #                    Accession #                 Episode #   1  [id number]                  [id number]                  [id number]   2  [id number]                  [id number]                  [id number]  ---------------------------------------------------------------------- Indications   Maternal care for known or suspected poor      O36.5930   fetal growth, third trimester, not applicable or   unspecified   Poor obstetric history: Previous preterm       O09.219   delivery, antepartum (@34  weeks)   Insufficient Prenatal Care                     O09.30   Fetal abnormality - other known or             O35.9XX0   suspected (EIFLV)   [redacted] weeks gestation of pregnancy                 Z3A.35  ---------------------------------------------------------------------- Vital Signs                                                 Height:        5'10" ---------------------------------------------------------------------- Fetal Evaluation  Num Of Fetuses:         1  Fetal Heart Rate(bpm):  141  Cardiac Activity:       Observed  Presentation:           Cephalic  Amniotic Fluid  AFI FV:      Within normal limits  AFI Sum(cm)     %  Tile       Largest Pocket(cm)  11.57           32          4.71  RUQ(cm)       RLQ(cm)       LUQ(cm)        LLQ(cm)  1.72          2.86          4.71           2.28 ---------------------------------------------------------------------- Biophysical Evaluation  Amniotic F.V:   Pocket => 2 cm two         F. Tone:        Observed                  planes  F. Movement:    Observed                   N.S.T:          Reactive  F. Breathing:   Observed                   Score:          10/10 ---------------------------------------------------------------------- OB History  Gravidity:    6         Term:   3        Prem:   1        SAB:   2  TOP:          0       Ectopic:  0        Living: 3 ---------------------------------------------------------------------- Gestational Age  LMP:           35w 2d        Date:  05/25/18                 EDD:   03/01/19  Best:          Consuello Closs 2d     Det. By:  LMP  (05/25/18)          EDD:   03/01/19 ---------------------------------------------------------------------- Doppler - Fetal Vessels  Umbilical Artery   S/D     %tile                                            ADFV    RDFV  3.48       93                                                No      No ---------------------------------------------------------------------- Impression  Amniotic fluid is normal and good fetal activity is seen.  Antenatal testing is reassuring. NST is reactive. BPP 10/10.  Umbilical artery Doppler showed normal forward diastolic  flow.  We reassured the patient of the  findings. ---------------------------------------------------------------------- Recommendations  -Continue weekly BPP and Doppler till delivery. ----------------------------------------------------------------------                  Noralee Space, MD Electronically Signed Final Report   01/27/2019 03:28 pm ----------------------------------------------------------------------  [place] Mfm Ua Cord Doppler  Result Date: 01/20/2019 ----------------------------------------------------------------------  OBSTETRICS REPORT                       (  Signed Final 01/20/2019 01:10 pm) ---------------------------------------------------------------------- Patient Info  ID #:       [id number]                          D.O.B.:  01/28/1991 (27 yrs)  Name:       Mandy Vasquez                  Visit Date: 01/20/2019 11:17 am ---------------------------------------------------------------------- Performed By  Performed By:     Tomma Lightning             Ref. Address:     Faculty                    RDMS,RVT  Attending:        Noralee Space MD        [facility]  Referred By:      Catalina Antigua MD ---------------------------------------------------------------------- Orders   #  Description                          Code         Ordered By   1  [place] MFM OB FOLLOW UP                  40981.19     RAVI SHANKAR   2  [place] MFM UA CORD DOPPLER               76820.02     RAVI SHANKAR   3  [place] MFM FETAL BPP                     14782.9      RAVI [facility]      W/NONSTRESS  ----------------------------------------------------------------------   #  Order #                    Accession #                 Episode #   1  [id number]                  [id number]                  [id number]   2  [id number]                  [id number]                  [id number]   3  [id number]                  [id number]                  [id number]   ---------------------------------------------------------------------- Indications   Maternal care for known or suspected poor      O36.5930   fetal growth, third trimester, not applicable or  unspecified   Encounter for other antenatal screening        Z36.2   follow-up   [redacted] weeks gestation of pregnancy                Z3A.34   Poor obstetric history: Previous preterm       O09.219   delivery, antepartum (@34  weeks)   Insufficient Prenatal Care                     O09.30   Fetal abnormality - other known or             O35.9XX0   suspected (EIFLV)  ---------------------------------------------------------------------- Vital Signs  Weight (lb): 140                               Height:        5'10"  BMI:         20.09 ---------------------------------------------------------------------- Fetal Evaluation  Num Of Fetuses:         1  Fetal Heart Rate(bpm):  142  Cardiac Activity:       Observed  Presentation:           Cephalic  Placenta:               Posterior  P. Cord Insertion:      Previously Visualized  Amniotic Fluid  AFI FV:      Within normal limits  AFI Sum(cm)     %Tile       Largest Pocket(cm)  10.33           22          3.97  RUQ(cm)       RLQ(cm)       LUQ(cm)        LLQ(cm)  2.94          1.92          1.5            3.97 ---------------------------------------------------------------------- Biophysical Evaluation  Amniotic F.V:   Within normal limits       F. Tone:        Observed  F. Movement:    Observed                   N.S.T:          Reactive  F. Breathing:   Not Observed               Score:          8/10 ---------------------------------------------------------------------- Biometry  BPD:      77.6  mm     G. Age:  31w 1d        < 1  %    CI:        75.69   %    70 - 86                                                          FL/HC:      20.9   %    19.4 - 21.8  HC:      282.8  mm     G. Age:  31w 0d        <  3  %    HC/AC:      1.13        0.96 - 1.11  AC:      251.2  mm     G. Age:  29w 2d         < 3  %    FL/BPD:     76.2   %    71 - 87  FL:       59.1  mm     G. Age:  30w 6d        < 3  %    FL/AC:      23.5   %    20 - 24  HUM:        53  mm     G. Age:  30w 6d        < 5  %  Est. FW:    1513  gm      3 lb 5 oz   < 10  % ---------------------------------------------------------------------- OB History  Gravidity:    6         Term:   3        Prem:   1        SAB:   2  TOP:          0       Ectopic:  0        Living: 3 ---------------------------------------------------------------------- Gestational Age  LMP:           34w 2d        Date:  05/25/18                 EDD:   03/01/19  U/S Today:     30w 4d                                        EDD:   03/27/19  Best:          34w 2d     Det. By:  LMP  (05/25/18)          EDD:   03/01/19 ---------------------------------------------------------------------- Anatomy  Cranium:               Appears normal         Aortic Arch:            Previously seen  Cavum:                 Previously seen        Ductal Arch:            Previously seen  Ventricles:            Previously seen        Diaphragm:              Appears normal  Choroid Plexus:        Previously seen        Stomach:                Appears normal, left  sided  Cerebellum:            Previously seen        Abdomen:                Appears normal  Posterior Fossa:       Previously seen        Abdominal Wall:         Previously seen  Nuchal Fold:           Previously seen        Cord Vessels:           Previously seen  Face:                  Orbits and profile     Kidneys:                Appear normal                         previously seen  Lips:                  Previously seen        Bladder:                Appears normal  Thoracic:              Appears normal         Spine:                  Previously seen  Heart:                 Appears normal         Upper Extremities:      Previously seen                         (4CH, axis,  and                         situs)  RVOT:                  Previously seen        Lower Extremities:      Previously seen  LVOT:                  Appears normal  Other:  Heels and 5th digit prev visualized. ---------------------------------------------------------------------- Doppler - Fetal Vessels  Umbilical Artery   S/D     %tile                                            ADFV    RDFV  3.37       90                                                No      No ---------------------------------------------------------------------- Cervix Uterus Adnexa  Cervix  Not visualized (advanced GA >24wks) ---------------------------------------------------------------------- Impression  Patient returned for fetal growth assessment. Her previous  child weighed 5-12 at birth.  On ultrasound, the estimated fetal weight is at less than the  10th percentile. Head circumference measurement is  at -2  SD (normal). Amniotic fluid is normal and good fetal activity is  seen. Fetal breathing movements did not meet the criteria of  BPP. Umbilical artery Doppler study is normal. NST is  reactive. BPP 8/10.  I explained the significance of findings that small fetus is  more-likely to be constitutional. However, we recommend  weekly antenatal testing till delivery. I explained the  components of BPP. ---------------------------------------------------------------------- Recommendations  -Weekly BPP, NST and umbilical artery Doppler till delivery.  -We will address timing of delivery on her next visit. ----------------------------------------------------------------------                  Noralee Space, MD Electronically Signed Final Report   01/20/2019 01:10 pm ----------------------------------------------------------------------   Assessment and Plan:  Pregnancy: [id number] at [redacted]w[redacted]d 1. Supervision of high risk pregnancy, antepartum  2. Insufficient antepartum care  3. Pregnancy affected by fetal growth restriction - ultrasound  yesterday revealed EFW < 10th percentile, but good interval growth and normal AFI   Dopplers normal with normal fetal activity  4. Chlamydia infection affecting pregnancy in second and third trimester, treated last on 02-06-2019   Term labor symptoms and general obstetric precautions including but not limited to vaginal bleeding, contractions, leaking of fluid and fetal movement were reviewed in detail with the patient. I discussed the assessment and treatment plan with the patient. The patient was provided an opportunity to ask questions and all were answered. The patient agreed with the plan and demonstrated an understanding of the instructions. The patient was advised to call back or seek an in-person office evaluation/go to [facility] at [facility] for any urgent or concerning symptoms. Please refer to After Visit Summary for other counseling recommendations.   I provided 15 minutes of face-to-face time during this encounter.  Return in about 1 week (around 02/20/2019) for ROB in office.  Future Appointments  Date Time Provider Department Center  02/19/2019  1:30 PM [facility] NURSE [facility]  02/19/2019  1:30 PM [facility] [place] 1 [facility]    Coral Ceo, MD [facility], [facility] 02-13-2019

## 2019-02-14 ENCOUNTER — Telehealth (HOSPITAL_COMMUNITY): Payer: Self-pay | Admitting: *Deleted

## 2019-02-14 NOTE — Telephone Encounter (Signed)
Preadmission screen  

## 2019-02-15 ENCOUNTER — Other Ambulatory Visit (HOSPITAL_COMMUNITY): Payer: Self-pay | Admitting: Advanced Practice Midwife

## 2019-02-15 DIAGNOSIS — O0990 Supervision of high risk pregnancy, unspecified, unspecified trimester: Secondary | ICD-10-CM

## 2019-02-17 ENCOUNTER — Telehealth (HOSPITAL_COMMUNITY): Payer: Self-pay | Admitting: *Deleted

## 2019-02-17 ENCOUNTER — Encounter (HOSPITAL_COMMUNITY): Payer: Self-pay | Admitting: *Deleted

## 2019-02-17 ENCOUNTER — Telehealth: Payer: Self-pay

## 2019-02-17 NOTE — Telephone Encounter (Signed)
Preadmission screen  

## 2019-02-17 NOTE — Telephone Encounter (Signed)
Patient has induction scheduled on 02-22-19 at 7am Pt was told she would have to have COVID-19 testing done or she would not be able to get an epidural. Pt states she does not want test done. Pt was told to contact our office regarding her concerns. Pt wants confirmation if she "has to" have COVID-19 testing done. Pt mentioned she is not able to tolerate test performed the way the COVID test has to be done.

## 2019-02-19 ENCOUNTER — Ambulatory Visit (HOSPITAL_COMMUNITY)
Admission: RE | Admit: 2019-02-19 | Discharge: 2019-02-19 | Disposition: A | Discharge status: Home / Self Care | Payer: Medicaid Other | Source: Ambulatory Visit | Attending: Obstetrics and Gynecology | Admitting: Obstetrics and Gynecology

## 2019-02-19 ENCOUNTER — Ambulatory Visit (HOSPITAL_COMMUNITY): Payer: Medicaid Other | Admitting: *Deleted

## 2019-02-19 ENCOUNTER — Other Ambulatory Visit: Payer: Self-pay

## 2019-02-19 ENCOUNTER — Other Ambulatory Visit (HOSPITAL_COMMUNITY)
Admission: RE | Admit: 2019-02-19 | Discharge: 2019-02-19 | Disposition: A | Discharge status: Home / Self Care | Payer: Medicaid Other | Source: Ambulatory Visit | Attending: Obstetrics and Gynecology | Admitting: Obstetrics and Gynecology

## 2019-02-19 ENCOUNTER — Encounter (HOSPITAL_COMMUNITY): Payer: Self-pay

## 2019-02-19 VITALS — BP 123/65 | HR 66 | Temp 98.0°F

## 2019-02-19 DIAGNOSIS — O365930 Maternal care for other known or suspected poor fetal growth, third trimester, not applicable or unspecified: Secondary | ICD-10-CM | POA: Insufficient documentation

## 2019-02-19 DIAGNOSIS — O0933 Supervision of pregnancy with insufficient antenatal care, third trimester: Secondary | ICD-10-CM | POA: Diagnosis not present

## 2019-02-19 DIAGNOSIS — O365990 Maternal care for other known or suspected poor fetal growth, unspecified trimester, not applicable or unspecified: Secondary | ICD-10-CM | POA: Diagnosis not present

## 2019-02-19 DIAGNOSIS — O98812 Other maternal infectious and parasitic diseases complicating pregnancy, second trimester: Secondary | ICD-10-CM

## 2019-02-19 DIAGNOSIS — O359XX0 Maternal care for (suspected) fetal abnormality and damage, unspecified, not applicable or unspecified: Secondary | ICD-10-CM

## 2019-02-19 DIAGNOSIS — Z3A38 38 weeks gestation of pregnancy: Secondary | ICD-10-CM | POA: Diagnosis not present

## 2019-02-19 DIAGNOSIS — O0930 Supervision of pregnancy with insufficient antenatal care, unspecified trimester: Secondary | ICD-10-CM | POA: Diagnosis not present

## 2019-02-19 DIAGNOSIS — O0990 Supervision of high risk pregnancy, unspecified, unspecified trimester: Secondary | ICD-10-CM

## 2019-02-19 DIAGNOSIS — O09213 Supervision of pregnancy with history of pre-term labor, third trimester: Secondary | ICD-10-CM | POA: Diagnosis not present

## 2019-02-19 DIAGNOSIS — A749 Chlamydial infection, unspecified: Secondary | ICD-10-CM | POA: Diagnosis not present

## 2019-02-19 NOTE — Procedures (Signed)
Mandy Vasquez 1991/03/14 [redacted]w[redacted]d  Fetus A Non-Stress Test Interpretation for 02/19/19  Indication: IUGR  Fetal Heart Rate A Mode: External Baseline Rate (A): 135 bpm Variability: Moderate Accelerations: 15 x 15 Decelerations: None Multiple birth?: No  Uterine Activity Mode: Toco Contraction Frequency (min): occ OC noted Contraction Duration (sec): 60-90 Contraction Quality: Mild Resting Tone Palpated: Relaxed Resting Time: Adequate  Interpretation (Fetal Testing) Nonstress Test Interpretation: Reactive Comments: FHR tracing rev'd by Dr. Grace Bushy

## 2019-02-19 NOTE — Telephone Encounter (Signed)
Patient voiced understanding and was appreciative.

## 2019-02-21 ENCOUNTER — Other Ambulatory Visit: Payer: Self-pay | Admitting: Advanced Practice Midwife

## 2019-02-21 ENCOUNTER — Other Ambulatory Visit (HOSPITAL_COMMUNITY): Payer: Self-pay | Admitting: *Deleted

## 2019-02-22 ENCOUNTER — Inpatient Hospital Stay (HOSPITAL_COMMUNITY): Payer: Medicaid Other | Admitting: Anesthesiology

## 2019-02-22 ENCOUNTER — Inpatient Hospital Stay (HOSPITAL_COMMUNITY): Payer: Medicaid Other

## 2019-02-22 ENCOUNTER — Other Ambulatory Visit: Payer: Self-pay

## 2019-02-22 ENCOUNTER — Inpatient Hospital Stay (HOSPITAL_COMMUNITY)
Admission: AD | Admit: 2019-02-22 | Discharge: 2019-02-23 | DRG: 807 | Disposition: A | Discharge status: Home / Self Care | Payer: Medicaid Other | Attending: Obstetrics and Gynecology | Admitting: Obstetrics and Gynecology

## 2019-02-22 ENCOUNTER — Encounter (HOSPITAL_COMMUNITY): Payer: Self-pay | Admitting: *Deleted

## 2019-02-22 DIAGNOSIS — O0930 Supervision of pregnancy with insufficient antenatal care, unspecified trimester: Secondary | ICD-10-CM

## 2019-02-22 DIAGNOSIS — Z3A39 39 weeks gestation of pregnancy: Secondary | ICD-10-CM | POA: Diagnosis not present

## 2019-02-22 DIAGNOSIS — O98812 Other maternal infectious and parasitic diseases complicating pregnancy, second trimester: Secondary | ICD-10-CM

## 2019-02-22 DIAGNOSIS — O365930 Maternal care for other known or suspected poor fetal growth, third trimester, not applicable or unspecified: Secondary | ICD-10-CM | POA: Diagnosis not present

## 2019-02-22 DIAGNOSIS — A749 Chlamydial infection, unspecified: Secondary | ICD-10-CM

## 2019-02-22 DIAGNOSIS — O365990 Maternal care for other known or suspected poor fetal growth, unspecified trimester, not applicable or unspecified: Secondary | ICD-10-CM | POA: Diagnosis present

## 2019-02-22 DIAGNOSIS — O0990 Supervision of high risk pregnancy, unspecified, unspecified trimester: Secondary | ICD-10-CM

## 2019-02-22 LAB — TYPE AND SCREEN
ABO/RH(D): B POS
Antibody Screen: NEGATIVE

## 2019-02-22 LAB — CBC
HCT: 30.7 % — ABNORMAL LOW (ref 36.0–46.0)
Hemoglobin: 10.2 g/dL — ABNORMAL LOW (ref 12.0–15.0)
MCH: 28.8 pg (ref 26.0–34.0)
MCHC: 33.2 g/dL (ref 30.0–36.0)
MCV: 86.7 fL (ref 80.0–100.0)
Platelets: 196 10*3/uL (ref 150–400)
RBC: 3.54 MIL/uL — ABNORMAL LOW (ref 3.87–5.11)
RDW: 13.2 % (ref 11.5–15.5)
WBC: 10.6 10*3/uL — ABNORMAL HIGH (ref 4.0–10.5)
nRBC: 0 % (ref 0.0–0.2)

## 2019-02-22 MED ORDER — DIPHENHYDRAMINE HCL 50 MG/ML IJ SOLN: 12.5000 mg | INTRAMUSCULAR | Status: DC | PRN

## 2019-02-22 MED ORDER — ACETAMINOPHEN 325 MG PO TABS
650.0000 mg | ORAL_TABLET | ORAL | Status: DC | PRN
  Administered 2019-02-23 (×2): 650 mg via ORAL
  Filled 2019-02-22 (×3): qty 2

## 2019-02-22 MED ORDER — ACETAMINOPHEN 325 MG PO TABS: 650.0000 mg | ORAL_TABLET | ORAL | Status: DC | PRN

## 2019-02-22 MED ORDER — FERROUS SULFATE 325 (65 FE) MG PO TABS
325.0000 mg | ORAL_TABLET | Freq: Two times a day (BID) | ORAL | Status: DC
  Administered 2019-02-23: 325 mg via ORAL
  Filled 2019-02-22 (×2): qty 1

## 2019-02-22 MED ORDER — LIDOCAINE HCL (PF) 1 % IJ SOLN: 30.0000 mL | INTRAMUSCULAR | Status: DC | PRN

## 2019-02-22 MED ORDER — LIDOCAINE HCL (PF) 1 % IJ SOLN
INTRAMUSCULAR | Status: DC | PRN
  Administered 2019-02-22: 5 mL via EPIDURAL

## 2019-02-22 MED ORDER — SIMETHICONE 80 MG PO CHEW: 80.0000 mg | CHEWABLE_TABLET | ORAL | Status: DC | PRN

## 2019-02-22 MED ORDER — SODIUM CHLORIDE (PF) 0.9 % IJ SOLN
INTRAMUSCULAR | Status: DC | PRN
  Administered 2019-02-22: 12 mL/h via EPIDURAL

## 2019-02-22 MED ORDER — EPHEDRINE 5 MG/ML INJ: 10.0000 mg | INTRAVENOUS | Status: DC | PRN

## 2019-02-22 MED ORDER — MAGNESIUM HYDROXIDE 400 MG/5ML PO SUSP: 30.0000 mL | ORAL | Status: DC | PRN

## 2019-02-22 MED ORDER — COCONUT OIL OIL: 1.0000 "application " | TOPICAL_OIL | Status: DC | PRN

## 2019-02-22 MED ORDER — LACTATED RINGERS IV SOLN
INTRAVENOUS | Status: DC
  Administered 2019-02-22 (×2): via INTRAVENOUS

## 2019-02-22 MED ORDER — ONDANSETRON HCL 4 MG PO TABS: 4.0000 mg | ORAL_TABLET | ORAL | Status: DC | PRN

## 2019-02-22 MED ORDER — ONDANSETRON HCL 4 MG/2ML IJ SOLN: 4.0000 mg | INTRAMUSCULAR | Status: DC | PRN

## 2019-02-22 MED ORDER — FENTANYL-BUPIVACAINE-NACL 0.5-0.125-0.9 MG/250ML-% EP SOLN: 12.0000 mL/h | EPIDURAL | Status: DC | PRN

## 2019-02-22 MED ORDER — FENTANYL CITRATE (PF) 100 MCG/2ML IJ SOLN: 50.0000 ug | INTRAMUSCULAR | Status: DC | PRN

## 2019-02-22 MED ORDER — MISOPROSTOL 25 MCG QUARTER TABLET
25.0000 ug | ORAL_TABLET | Freq: Once | ORAL | Status: AC
  Administered 2019-02-22: 25 ug via VAGINAL

## 2019-02-22 MED ORDER — BENZOCAINE-MENTHOL 20-0.5 % EX AERO: 1.0000 "application " | INHALATION_SPRAY | CUTANEOUS | Status: DC | PRN

## 2019-02-22 MED ORDER — OXYTOCIN BOLUS FROM INFUSION
500.0000 mL | Freq: Once | INTRAVENOUS | Status: AC
  Administered 2019-02-22: 500 mL via INTRAVENOUS

## 2019-02-22 MED ORDER — PHENYLEPHRINE 40 MCG/ML (10ML) SYRINGE FOR IV PUSH (FOR BLOOD PRESSURE SUPPORT): 80.0000 ug | PREFILLED_SYRINGE | INTRAVENOUS | Status: DC | PRN

## 2019-02-22 MED ORDER — FLEET ENEMA 7-19 GM/118ML RE ENEM: 1.0000 | ENEMA | RECTAL | Status: DC | PRN

## 2019-02-22 MED ORDER — OXYCODONE-ACETAMINOPHEN 5-325 MG PO TABS: 1.0000 | ORAL_TABLET | ORAL | Status: DC | PRN

## 2019-02-22 MED ORDER — DIBUCAINE (PERIANAL) 1 % EX OINT: 1.0000 "application " | TOPICAL_OINTMENT | CUTANEOUS | Status: DC | PRN

## 2019-02-22 MED ORDER — MISOPROSTOL 25 MCG QUARTER TABLET
ORAL_TABLET | ORAL | Status: AC
  Administered 2019-02-22: 25 ug via VAGINAL
  Filled 2019-02-22: qty 1

## 2019-02-22 MED ORDER — FENTANYL-BUPIVACAINE-NACL 0.5-0.125-0.9 MG/250ML-% EP SOLN
12.0000 mL/h | EPIDURAL | Status: DC | PRN
  Filled 2019-02-22: qty 250

## 2019-02-22 MED ORDER — IBUPROFEN 600 MG PO TABS
600.0000 mg | ORAL_TABLET | Freq: Four times a day (QID) | ORAL | Status: DC
  Administered 2019-02-22 – 2019-02-23 (×5): 600 mg via ORAL
  Filled 2019-02-22 (×5): qty 1

## 2019-02-22 MED ORDER — HYDROXYZINE HCL 50 MG PO TABS: 50.0000 mg | ORAL_TABLET | Freq: Four times a day (QID) | ORAL | Status: DC | PRN

## 2019-02-22 MED ORDER — SOD CITRATE-CITRIC ACID 500-334 MG/5ML PO SOLN: 30.0000 mL | ORAL | Status: DC | PRN

## 2019-02-22 MED ORDER — WITCH HAZEL-GLYCERIN EX PADS: 1.0000 "application " | MEDICATED_PAD | CUTANEOUS | Status: DC | PRN

## 2019-02-22 MED ORDER — OXYTOCIN 40 UNITS IN NORMAL SALINE INFUSION - SIMPLE MED
2.5000 [IU]/h | INTRAVENOUS | Status: DC
  Administered 2019-02-22: 2.5 [IU]/h via INTRAVENOUS
  Filled 2019-02-22: qty 1000

## 2019-02-22 MED ORDER — LACTATED RINGERS IV SOLN: 500.0000 mL | Freq: Once | INTRAVENOUS | Status: DC

## 2019-02-22 MED ORDER — ONDANSETRON HCL 4 MG/2ML IJ SOLN: 4.0000 mg | Freq: Four times a day (QID) | INTRAMUSCULAR | Status: DC | PRN

## 2019-02-22 MED ORDER — LACTATED RINGERS IV SOLN: 500.0000 mL | INTRAVENOUS | Status: DC | PRN

## 2019-02-22 MED ORDER — LACTATED RINGERS IV SOLN
500.0000 mL | Freq: Once | INTRAVENOUS | Status: AC
  Administered 2019-02-22: 500 mL via INTRAVENOUS

## 2019-02-22 MED ORDER — PRENATAL MULTIVITAMIN CH
1.0000 | ORAL_TABLET | Freq: Every day | ORAL | Status: DC
  Administered 2019-02-23: 1 via ORAL
  Filled 2019-02-22: qty 1

## 2019-02-22 MED ORDER — DIPHENHYDRAMINE HCL 25 MG PO CAPS: 25.0000 mg | ORAL_CAPSULE | Freq: Four times a day (QID) | ORAL | Status: DC | PRN

## 2019-02-22 MED ORDER — OXYCODONE-ACETAMINOPHEN 5-325 MG PO TABS: 2.0000 | ORAL_TABLET | ORAL | Status: DC | PRN

## 2019-02-22 MED ORDER — PHENYLEPHRINE 40 MCG/ML (10ML) SYRINGE FOR IV PUSH (FOR BLOOD PRESSURE SUPPORT)
80.0000 ug | PREFILLED_SYRINGE | INTRAVENOUS | Status: DC | PRN
  Filled 2019-02-22: qty 10

## 2019-02-22 NOTE — Anesthesia Preprocedure Evaluation (Signed)
Anesthesia Evaluation  Patient identified by MRN, date of birth, ID band Patient awake    Reviewed: Allergy & Precautions, Patient's Chart, lab work & pertinent test results  Airway Mallampati: I       Dental no notable dental hx.    Pulmonary neg pulmonary ROS,    Pulmonary exam normal        Cardiovascular negative cardio ROS   Rhythm:Regular Rate:Normal     Neuro/Psych Anxiety negative neurological ROS     GI/Hepatic negative GI ROS, Neg liver ROS,   Endo/Other  negative endocrine ROS  Renal/GU negative Renal ROS     Musculoskeletal   Abdominal   Peds  Hematology   Anesthesia Other Findings   Reproductive/Obstetrics (+) Pregnancy                             Anesthesia Physical Anesthesia Plan  ASA: II  Anesthesia Plan: Epidural   Post-op Pain Management:    Induction:   PONV Risk Score and Plan:   Airway Management Planned:   Additional Equipment: None  Intra-op Plan:   Post-operative Plan:   Informed Consent: I have reviewed the patients History and Physical, chart, labs and discussed the procedure including the risks, benefits and alternatives for the proposed anesthesia with the patient or authorized representative who has indicated his/her understanding and acceptance.       Plan Discussed with:   Anesthesia Plan Comments: (Lab Results      Component                Value               Date                      WBC                      10.6 (H)            02/22/2019                HGB                      10.2 (L)            02/22/2019                HCT                      30.7 (L)            02/22/2019                MCV                      86.7                02/22/2019                PLT                      196                 02/22/2019           )        Anesthesia Quick Evaluation

## 2019-02-22 NOTE — Anesthesia Procedure Notes (Signed)
Epidural Patient location during procedure: OB Start time: 02/22/2019 3:14 PM End time: 02/22/2019 3:20 PM  Staffing Anesthesiologist: Effie Berkshire, MD Performed: anesthesiologist   Preanesthetic Checklist Completed: patient identified, site marked, surgical consent, pre-op evaluation, timeout performed, IV checked, risks and benefits discussed and monitors and equipment checked  Epidural Patient position: sitting Prep: ChloraPrep Patient monitoring: heart rate, continuous pulse ox and blood pressure Approach: midline Location: L3-L4 Injection technique: LOR saline  Needle:  Needle type: Tuohy  Needle gauge: 17 G Needle length: 9 cm Catheter type: closed end flexible Catheter size: 20 Guage Test dose: negative and 1.5% lidocaine  Assessment Events: blood not aspirated, injection not painful, no injection resistance and no paresthesia  Additional Notes LOR @ 4  Patient identified. Risks/Benefits/Options discussed with patient including but not limited to bleeding, infection, nerve damage, paralysis, failed block, incomplete pain control, headache, blood pressure changes, nausea, vomiting, reactions to medications, itching and postpartum back pain. Confirmed with bedside nurse the patient's most recent platelet count. Confirmed with patient that they are not currently taking any anticoagulation, have any bleeding history or any family history of bleeding disorders. Patient expressed understanding and wished to proceed. All questions were answered. Sterile technique was used throughout the entire procedure. Please see nursing notes for vital signs. Test dose was given through epidural catheter and negative prior to continuing to dose epidural or start infusion. Warning signs of high block given to the patient including shortness of breath, tingling/numbness in hands, complete motor block, or any concerning symptoms with instructions to call for help. Patient was given instructions on  fall risk and not to get out of bed. All questions and concerns addressed with instructions to call with any issues or inadequate analgesia.    Reason for block:procedure for pain

## 2019-02-22 NOTE — H&P (Addendum)
Mandy Vasquez is a 28 y.o. female presenting for IOL for FGR. At time of admission she reports occasional painful lower abdominal contractions. She denies vaginal bleeding, leaking of fluid, decreased fetal movement, fever, falls, or recent illness.    Prenatal Hx --Dating by LMP c/w 7w 6d [place] --PNC initiated at 19w 3d --IUGR EFW 2295g (<10%) at 37w 4d   OB History    Gravida  6   Para  3   Term  2   Preterm  1   AB  2   Living  3     SAB  2   TAB      Ectopic      Multiple      Live Births  3          Patient Active Problem List   Diagnosis Date Noted  . Fetal growth retardation, antepartum 02/22/2019  . Pregnancy affected by fetal growth restriction 02/06/2019  . Chlamydia infection affecting pregnancy in second trimester 10/09/2018  . Supervision of high risk pregnancy, antepartum 10/08/2018  . Prenatal care insufficient 10/08/2018  . History of preterm delivery, currently pregnant 04/02/2017    Past Medical History:  Diagnosis Date  . Anxiety    Past Surgical History:  Procedure Laterality Date  . NO PAST SURGERIES     Family History: family history includes Diabetes in her maternal aunt and maternal grandmother; Heart disease in her maternal aunt and maternal grandmother. Social History:  reports that she has never smoked. She has never used smokeless tobacco. She reports current drug use. Frequency: 2.00 times per week. Drug: Marijuana. She reports that she does not drink alcohol.     Maternal Diabetes: No Genetic Screening: Declined Maternal Ultrasounds/Referrals: Abnormal:  Findings:   IUGR Fetal Ultrasounds or other Referrals:  Referred to Materal Fetal Medicine  Maternal Substance Abuse:  No Significant Maternal Medications:  None Significant Maternal Lab Results:  None Other Comments:  + Trich and Chlamydia, no TOC following tx 02/06/19  Review of Systems  Constitutional: Negative for chills and fever.  Respiratory: Negative for  shortness of breath.   Gastrointestinal: Positive for abdominal pain.  Genitourinary: Negative for flank pain.  Neurological: Negative for headaches.  All other systems reviewed and are negative. Dilation: 2 Effacement (%): 50 Station: -1 Exam by:: Maryelizabeth Kaufmann, CNM  Blood pressure 113/77, pulse 75, temperature 98.2 F (36.8 C), temperature source Oral, resp. rate 16, height 5\' 11"  (1.803 m), weight 67.1 kg, last menstrual period 05/25/2018, unknown if currently breastfeeding. Physical Exam  Nursing note and vitals reviewed. Constitutional: She is oriented to person, place, and time. She appears well-developed and well-nourished.  Cardiovascular: Normal rate.  Respiratory: Effort normal.  GI: Soft.  Gravid  Genitourinary:    Vagina normal.   Neurological: She is alert and oriented to person, place, and time.  Skin: Skin is warm and dry.  Psychiatric: She has a normal mood and affect. Her behavior is normal. Judgment and thought content normal.    Prenatal labs: ABO, Rh: --/--/PENDING (06/06 1025) Antibody: PENDING (06/06 0835) Rubella: 1.71 (01/21 1021) RPR: Non Reactive (04/15 0841)  HBsAg: Negative (01/21 1021)  HIV: Non Reactive (04/15 0841)  GBS:   NEGATIVE  Fetal Surveillance Reactive tracing: baseline 125, mod variability, positive accels, no decels Toco: irregular mild contractions q 2-8 min  Assessment/Plan: --28 y.o. [id number] at [redacted]w[redacted]d for IOL for IURG < 10% --Reactive tracing --2/50%/-1 and vertex by suture --FB + 25 mcg vaginal Cytotec placed without difficulty --  GC/C collected --Pt desires epidural in active labor --girl/breast milk via pump/undecided. Open to discussing IUD/Nexplanon  --Anticipate NSVD  Patient continues to decline COVID swab. Peds notified. CNM discussed impact on PPE, FOB to remain in room due to possible exposure,possible plans of care based on status at delivery. Pt verbalizes awareness.  Calvert CantorSamantha C Treyden Hakim, CNM 02/22/2019, 10:01  AM

## 2019-02-22 NOTE — Progress Notes (Signed)
Patient is refusing COVID test to be performed.  Spoke with midwife, [facility] and nursery.  Educated patient that if her support person leaves the premises, he would not be able to return into the building.  Per nursery and pediatrician, patient will be required to wear a mask and be 6 feet away from the infant once born.  She will have to pump and offer a bottle if she chooses to breastfeed.  Patient verbalizes understanding.

## 2019-02-22 NOTE — Discharge Summary (Signed)
Obstetrics Discharge Summary [facility]N [facility]   Patient Name: Mandy Vasquez DOB: [birth year] MRN: [id number]  Date of admission: 02/22/2019 Delivering MD: Calvert CantorWEINHOLD, SAMANTHA C   Date of discharge: 02/23/2019  Admitting diagnosis: PREGNANCY Intrauterine pregnancy: 5447w0d     Secondary diagnosis:   Active Problems:   Fetal growth retardation, antepartum  Additional problems:  . None     Discharge diagnosis: Term Pregnancy Delivered                                            Postpartum procedures: None  Complications: None  Outpatient Follow-Up: --4-6 weeks --Patient considering IUD for contraception  Hospital course: Mandy Vasquez is a 28 y.o. 5547w0d who was admitted for IOL for FGR. Her pregnancy was complicated by the following: History of preterm delivery, currently pregnant; Supervision of high risk pregnancy, antepartum; Prenatal care insufficient; Chlamydia infection affecting pregnancy in second trimester; Pregnancy affected by fetal growth restriction; and Fetal growth retardation, antepartum.Her labor course was unremarkable. Delivery was uncomplicated. Please see delivery/op note for additional details. Her postpartum course was uncomplicated. She was breastfeeding without difficulty. By day of discharge, she was passing flatus, urinating, eating and drinking without difficulty. Her pain was well-controlled, and she was discharged home with intstruction to take tylenol and ibuprofen for pain. She will follow-up in clinic in 4-6 weeks.   Physical exam  Vitals:   02/22/19 1815 02/22/19 2017 02/23/19 0030 02/23/19 0500  BP: 118/78 110/74 (!) 95/51 99/61  Pulse: 63 (!) 57 (!) 52 (!) 54  Resp: 18 18 18 18   Temp: 97.9 F (36.6 C) 97.8 F (36.6 C) 98.1 F (36.7 C) 98.2 F (36.8 C)  TempSrc:  Oral Oral Oral  SpO2:  100% 99%   Weight:      Height:       General: Alert, NAD, comfortable Lochia: appropriate Uterine Fundus: firm Incision: N/A DVT Evaluation: No evidence  of DVT seen on physical exam. Labs: Lab Results  Component Value Date   WBC 10.6 (H) 02/22/2019   HGB 10.2 (L) 02/22/2019   HCT 30.7 (L) 02/22/2019   MCV 86.7 02/22/2019   PLT 196 02/22/2019   CMP Latest Ref Rng & Units 12/26/2018  Glucose 70 - 99 mg/dL 80  BUN 6 - 20 mg/dL 13  Creatinine 5.780.44 - 4.691.00 mg/dL 6.290.57  Sodium 528135 - 413145 mmol/L 136  Potassium 3.5 - 5.1 mmol/L 3.7  Chloride 98 - 111 mmol/L 106  CO2 22 - 32 mmol/L 22  Calcium 8.9 - 10.3 mg/dL 2.4(M8.4(L)  Total Protein 6.5 - 8.1 g/dL 6.5  Total Bilirubin 0.3 - 1.2 mg/dL 0.4  Alkaline Phos 38 - 126 U/L 112  AST 15 - 41 U/L 15  ALT 0 - 44 U/L 11    Discharge instructions: Per After Visit Summary and "Baby and Me Booklet"  After visit meds:  Allergies as of 02/23/2019      Reactions   Latex Swelling   Onion Swelling      Medication List    STOP taking these medications   Comfort Fit Maternity Supp Med Misc   famotidine 20 MG tablet Commonly known as:  PEPCID   ferrous sulfate 325 (65 FE) MG tablet   metoCLOPramide 10 MG tablet Commonly known as:  REGLAN     TAKE these medications   acetaminophen 325 MG tablet Commonly known as:  Tylenol  Take 2 tablets (650 mg total) by mouth every 4 (four) hours as needed (for pain scale < 4).   benzocaine-Menthol 20-0.5 % Aero Commonly known as:  DERMOPLAST Apply 1 application topically as needed for irritation (perineal discomfort).   PrePLUS 27-1 MG Tabs Take 1 tablet by mouth daily. What changed:  medication strength   witch hazel-glycerin pad Commonly known as:  TUCKS Apply 1 application topically as needed for hemorrhoids.       Postpartum contraception: Depo Provera outpatient Diet: Routine Diet Activity: Advance as tolerated. Pelvic rest for 6 weeks.   Follow-up Appt:No future appointments. Follow-up Visit:No follow-ups on file.  Newborn Data: Live born female  Birth Weight: 5 lb 3.1 oz (2356 g) APGAR: 77, 9  Newborn Delivery   Birth date/time:   [birth year]/2019 15:55:00 Delivery type:  Vaginal, Spontaneous     Baby Feeding: Breast Disposition: home   Zettie Cooley, M.D.  Family Medicine  PGY-1 02/23/2019 5:29 PM  CNM DISCHARGE ATTESTATION  I have seen and examined this patient and agree with above documentation in the resident's note.   Darlina Rumpf, CNM 02/23/19 6:04 PM

## 2019-02-22 NOTE — Discharge Instructions (Signed)

## 2019-02-23 LAB — ABO/RH: ABO/RH(D): B POS

## 2019-02-23 MED ORDER — ACETAMINOPHEN 325 MG PO TABS: 650.0000 mg | ORAL_TABLET | ORAL | 3 refills | Status: DC | PRN

## 2019-02-23 MED ORDER — OXYCODONE HCL 5 MG PO TABS
5.0000 mg | ORAL_TABLET | Freq: Once | ORAL | Status: AC
  Administered 2019-02-23: 5 mg via ORAL
  Filled 2019-02-23: qty 1

## 2019-02-23 MED ORDER — WITCH HAZEL-GLYCERIN EX PADS: 1.0000 "application " | MEDICATED_PAD | CUTANEOUS | 12 refills | Status: DC | PRN

## 2019-02-23 MED ORDER — BENZOCAINE-MENTHOL 20-0.5 % EX AERO: 1.0000 "application " | INHALATION_SPRAY | CUTANEOUS | 0 refills | Status: DC | PRN

## 2019-02-23 MED ORDER — MEDROXYPROGESTERONE ACETATE 150 MG/ML IM SUSP: 150.0000 mg | Freq: Once | INTRAMUSCULAR | Status: DC

## 2019-02-23 MED ORDER — PREPLUS 27-1 MG PO TABS: 1.0000 | ORAL_TABLET | Freq: Every day | ORAL | 13 refills | Status: AC

## 2019-02-23 NOTE — Progress Notes (Signed)
Post Partum Day 1 Subjective: no complaints, up ad lib, voiding and tolerating PO  Objective: Blood pressure 99/61, pulse (!) 54, temperature 98.2 F (36.8 C), temperature source Oral, resp. rate 18, height 5\' 11"  (1.803 m), weight 67.1 kg, last menstrual period 05/25/2018, SpO2 99 %, unknown if currently breastfeeding. Intake/Output      06/06 0701 - 06/07 0700 06/07 0701 - 06/08 0700   I.V. (mL/kg) 801.8 (11.9)    Total Intake(mL/kg) 801.8 (11.9)    Urine (mL/kg/hr) 0    Blood 50    Total Output 50    Net +751.8         Urine Occurrence 4 x      Physical Exam:  General: alert, cooperative and no distress Lungs: CTAB, no crackles or wheezes Heart: RRR, no m/r/r.  Lochia: appropriate Uterine Fundus: firm Incision: NA DVT Evaluation: No evidence of DVT seen on physical exam.  Recent Labs    02/22/19 0845  HGB 10.2*  HCT 30.7*    Assessment/Plan: Plan for discharge tomorrow- baby staying for monitoring.  -- plans for depo in patient prior to discharge.    LOS: 1 day   Mandy Vasquez 02/23/2019, 10:38 AM

## 2019-02-23 NOTE — Clinical Social Work Maternal (Signed)
CLINICAL SOCIAL WORK MATERNAL/CHILD NOTE  Patient Details  Name: Mandy Vasquez MRN: [id number] Date of Birth: 02/22/2018  Date:  02/23/2019  Clinical Social Worker Initiating Note:  Willman Cuny. LCSWA  Date/Time: Initiated:  02/23/19/0840     Child's Name:  "Mandy Vasquez". (MOB and FOB still choosing name. )   Biological Parents:  Mother(Mandy Vasquez (MOB), Mandy Vasquez (FOB) )   Need for Interpreter:  None   Reason for Referral:  Current Substance Use/Substance Use During Pregnancy    Address:  [address] [place] [address]    Phone number:  [phone] (home)     Additional phone number: none   Household Members/Support Persons (HM/SP):   Household Member/Support Person 3, Household Member/Support Person 4   HM/SP Name Relationship DOB or Age  HM/SP -1   Ophia Ryther (MOB)   MOB   05/22/1992  HM/SP -2   Mandy Vasquez (FOB)   FOB   29  HM/SP -3 Mandy Vasquez (daughter)  Daughter  5  HM/SP -4 Mandy Vasquez (daughter)  Daughter   1  HM/SP -5   Mandy Vasquez (daughter)   daughter   2  HM/SP -6        HM/SP -7        HM/SP -8          Natural Supports (not living in the home):  Other (Comment)(none only FOB )   Professional Supports:   none   Employment: Unemployed(due to COVID. 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   Strengths:  Home prepared for child , Compliance with medical plan , Ability to meet basic needs    Psychotropic Medications:     None     Pediatrician:     [facility].   Pediatrician List:   [place]  [facility]   [place]    [place]    [place]    [place]    [place]      Pediatrician Fax Number:    Risk Factors/Current Problems:   None   Cognitive State:  Insightful , Alert , Able to Concentrate    Mood/Affect:  Interested , Calm , Comfortable    CSW Assessment: CSW consulted as MOB has history of anxiety and THC use during pregnancy. CSW spoke with MOB via phone to address further needs.   CSW called into MOB's room due to contact [recautions in place at this time for MOB. MOB answered the phon.e CSW introduced self as well as role here in the hospital. CSW advised MOB of the reason for the call. MOB expressed that she was diagnosed with anxiety at the age of 14. At that time MOB reports that she  was given medication however never took them. MOB declines ever being in therapy as well. MOB reports that she has been managing her anxiety through deep breathing exercises. MOB expressed that if she unable to gather herself doing this then she usually has a panic attack and has to go to the hospital. CSW was advised that this isn't often however.   CSW offered MOB resources for therapy and MOB declines them at this time. CSW spoke with MOB about substance use history.   MOB reports that she does "smoke weed". MOB unsure of last use but did confirm that she used during her pregnancy. CSW advised MOB of the hospital drug screen policy. CSW advised MOB that infants UDS is not back yet however if UDS or CDS comes back positive for any substances that MOB was not given while here or prescribed by an MD, then CPS report would need to be made. MOB expressed understanding and informed CSW that CPS was called for her last infant being positive for THC. MOB declined any further questions at this time.  CSW notified by MOB that she is from home with FOB Mandy and their older three daughters. MOB expressed that hey have no other family support. MOB expressed that she was working at [employer] as a waitress however due to COVID she is not working at this time. MOB gets WIC and Food Stamps. MOB expressed that she has all needed items to care for  infant including careseat for infant.   CSW provided education to MOB on SIDS and PPD. MOB expressed that she did have PPD with her lat two daughter that lasted for about 2 months .CSW explained to MOB that she is more susceptible to getting PPD again with this infant. CSW provided MOB with Postpartum Progress Checklist to keep track of feelings. MOB currently denies feeling SI or HI and reports that she feels fine but a little sore. CSW offered other resources with MOB declining at this time.   CSW will continue to monitor UDS and CDS for needed CSW report.   CSW Plan/Description:  No Further Intervention Required/No Barriers to Discharge, CSW Will Continue to Monitor Umbilical Cord Tissue Drug Screen Results and Make Report if Warranted, Hospital Drug Screen Policy Information, Sudden Infant Death Syndrome (SIDS) Education, Perinatal Mood and Anxiety Disorder (PMADs) Education    Osias Resnick S Jami Ohlin, LCSWA 02/23/2019, 9:10 AM  

## 2019-02-23 NOTE — Anesthesia Postprocedure Evaluation (Signed)
Anesthesia Post Note  Patient: Mandy Vasquez  Procedure(s) Performed: AN AD HOC LABOR EPIDURAL     Patient location during evaluation: Mother Baby Anesthesia Type: Epidural Level of consciousness: awake and alert Pain management: pain level controlled Vital Signs Assessment: post-procedure vital signs reviewed and stable Respiratory status: spontaneous breathing, nonlabored ventilation and respiratory function stable Cardiovascular status: stable Postop Assessment: no headache, no backache and epidural receding Anesthetic complications: no    Last Vitals:  Vitals:   02/23/19 0030 02/23/19 0500  BP: (!) 95/51 99/61  Pulse: (!) 52 (!) 54  Resp: 18 18  Temp: 36.7 C 36.8 C  SpO2: 99%     Last Pain:  Vitals:   02/23/19 0836  TempSrc:   PainSc: 3    Pain Goal: Patients Stated Pain Goal: 3 (02/23/19 0507)              Epidural/Spinal Function Cutaneous sensation: Normal sensation (02/23/19 0836)  Rayvon Char

## 2019-02-24 LAB — GC/CHLAMYDIA PROBE AMP (~~LOC~~) NOT AT ARMC
Chlamydia: NEGATIVE
Neisseria Gonorrhea: NEGATIVE

## 2019-03-05 LAB — RPR: RPR Ser Ql: NONREACTIVE

## 2019-03-31 ENCOUNTER — Ambulatory Visit: Payer: Medicaid Other | Admitting: Obstetrics and Gynecology

## 2019-04-14 ENCOUNTER — Ambulatory Visit (INDEPENDENT_AMBULATORY_CARE_PROVIDER_SITE_OTHER): Payer: Medicaid Other | Admitting: Obstetrics

## 2019-04-14 ENCOUNTER — Other Ambulatory Visit: Payer: Self-pay

## 2019-04-14 ENCOUNTER — Encounter: Payer: Self-pay | Admitting: Obstetrics

## 2019-04-14 ENCOUNTER — Ambulatory Visit: Payer: Medicaid Other | Admitting: Obstetrics

## 2019-04-14 DIAGNOSIS — Z1389 Encounter for screening for other disorder: Secondary | ICD-10-CM

## 2019-04-14 DIAGNOSIS — Z3202 Encounter for pregnancy test, result negative: Secondary | ICD-10-CM | POA: Diagnosis not present

## 2019-04-14 DIAGNOSIS — Z3009 Encounter for other general counseling and advice on contraception: Secondary | ICD-10-CM

## 2019-04-14 DIAGNOSIS — R630 Anorexia: Secondary | ICD-10-CM

## 2019-04-14 DIAGNOSIS — Z30013 Encounter for initial prescription of injectable contraceptive: Secondary | ICD-10-CM

## 2019-04-14 LAB — POCT URINE PREGNANCY: Preg Test, Ur: NEGATIVE

## 2019-04-14 MED ORDER — MEDROXYPROGESTERONE ACETATE 150 MG/ML IM SUSP: 150.0000 mg | INTRAMUSCULAR | 4 refills | Status: DC

## 2019-04-14 NOTE — Progress Notes (Signed)
Post Partum Exam  Mandy Vasquez is a 28 y.o. [id number] female who presents for a postpartum visit. She is 7 weeks postpartum following a spontaneous vaginal delivery. I have fully reviewed the prenatal and intrapartum course. The delivery was at 4 gestational weeks.  Anesthesia: epidural. Postpartum course has been unremarkable. Baby's course has been unremarkable. Baby is feeding by bottle - Carn[employer] Good Start. Bleeding no bleeding. Bowel function is normal. Bladder function is normal. Patient is sexually active. Contraception method is Depo-Provera injections. Postpartum depression screening:neg score:8  The following portions of the patient's history were reviewed and updated as appropriate: allergies, current medications, past family history, past medical history, past social history, past surgical history and problem list. Last pap smear done 10/08/18 and was Normal  Review of Systems A comprehensive review of systems was negative.    Objective:  Last menstrual period 05/25/2018, unknown if currently breastfeeding.  General:  alert and no distress   Breasts:  inspection negative, no nipple discharge or bleeding, no masses or nodularity palpable  Lungs: clear to auscultation bilaterally  Heart:  regular rate and rhythm, S1, S2 normal, no murmur, click, rub or gallop  Abdomen: soft, non-tender; bowel sounds normal; no masses,  no organomegaly   Vulva:  not evaluated  Vagina: not evaluated  Cervix:  not evaluated  Corpus: not examined  Adnexa:  not evaluated  Rectal Exam: Not performed.        Assessment:    1. Postpartum examination following vaginal delivery - doing well  2. Encounter for other general counseling and advice on contraception  - POCT urine pregnancy  3. Encounter for initial prescription of injectable contraceptive Rx: - medroxyPROGESTERone (DEPO-PROVERA) 150 MG/ML injection; Inject 1 mL (150 mg total) into the muscle every 3 (three) months.  Dispense: 1 mL;  Refill: 4  4. Poor appetite and weight loss Rx: - Boost caloric supplement Rx through [facility]:   1. Contraception: Depo-Provera injections 2. Continue PNV's 3. Follow up in: 6 months or as needed.   Shelly Bombard MD 04-14-2019

## 2019-04-15 ENCOUNTER — Telehealth: Payer: Self-pay

## 2019-04-15 NOTE — Telephone Encounter (Signed)
Attempted to contact about [facility] denial letter, no answer, left vm

## 2019-04-28 ENCOUNTER — Ambulatory Visit: Payer: Medicaid Other

## 2019-05-07 ENCOUNTER — Ambulatory Visit: Payer: Medicaid Other

## 2019-05-21 ENCOUNTER — Telehealth: Payer: Self-pay | Admitting: Obstetrics

## 2019-06-20 IMAGING — US US MFM UA CORD DOPPLER
1 series · 13 of 27 positions shown · non-contrast
Comparison: none

[Series 1: us mfm ua cord doppler · 27 acquisitions, 13 frames shown]
[im 2/27]
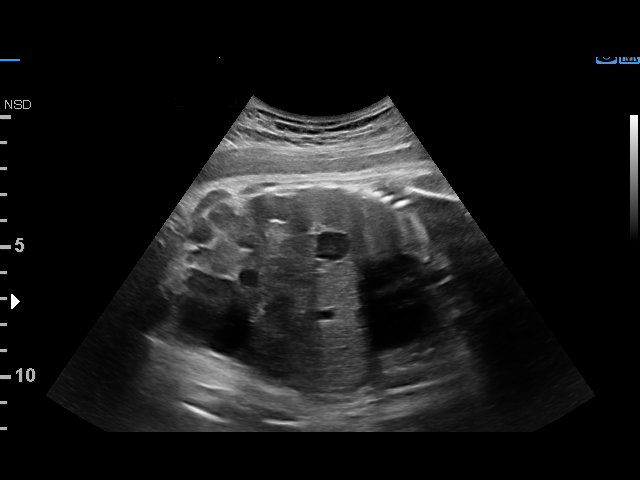
[im 4/27]
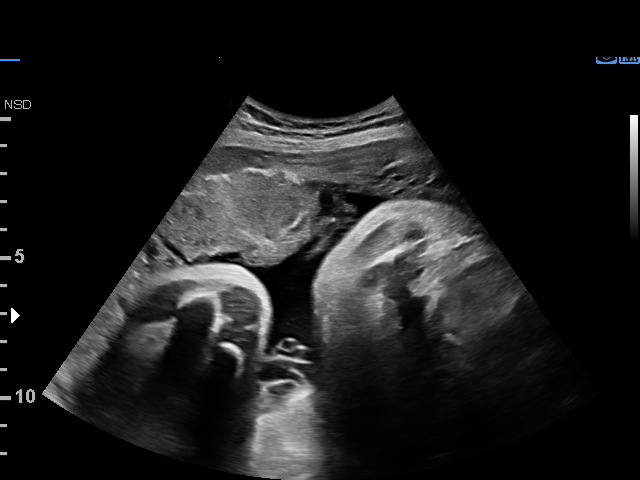
[im 6/27]
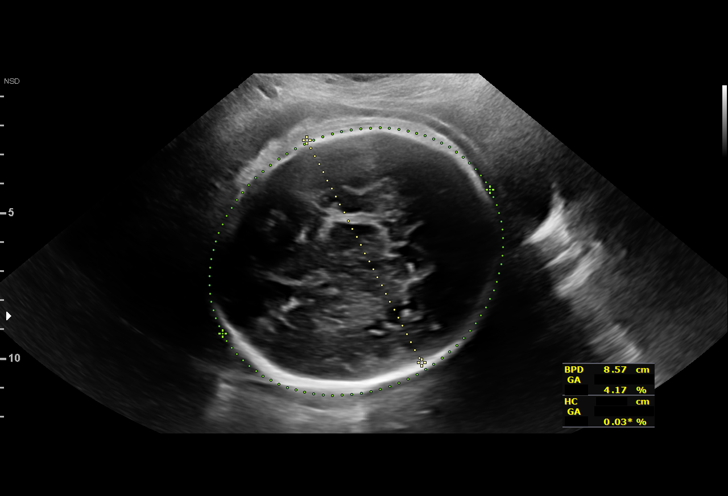
[im 8/27]
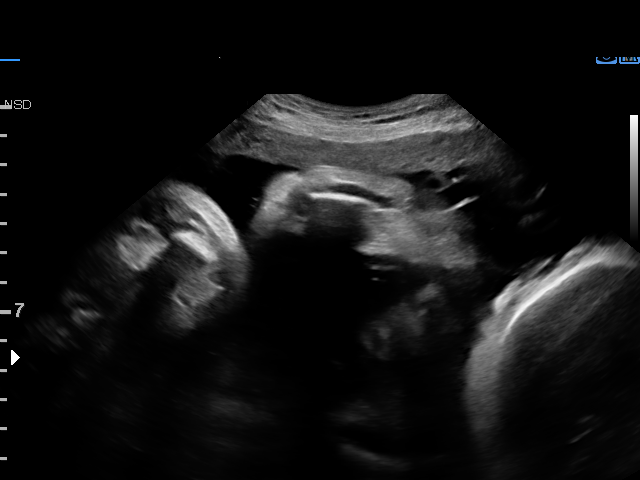
[im 10/27]
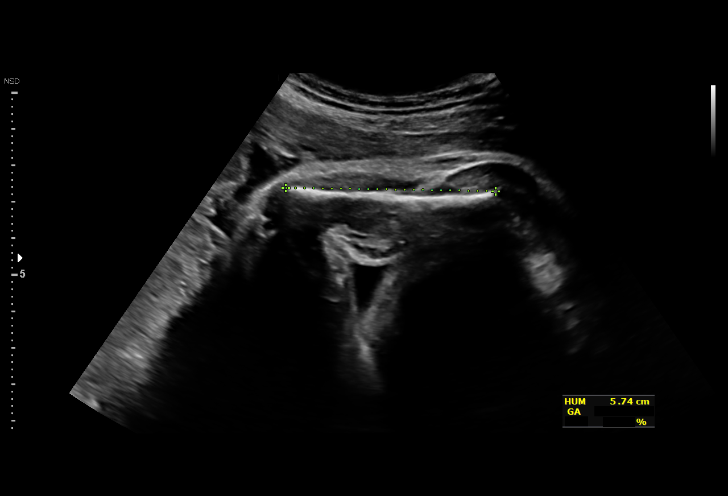
[im 12/27]
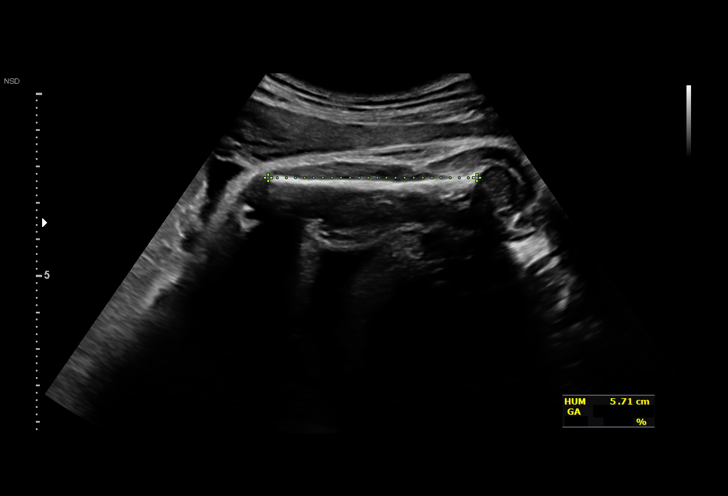
[im 14/27]
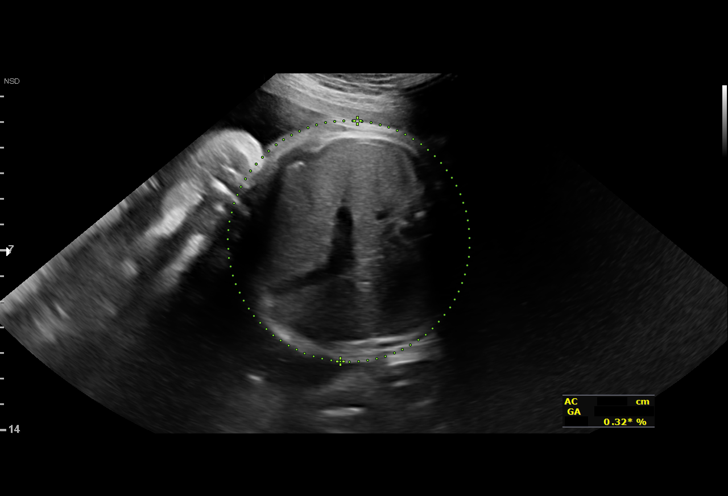
[im 16/27]
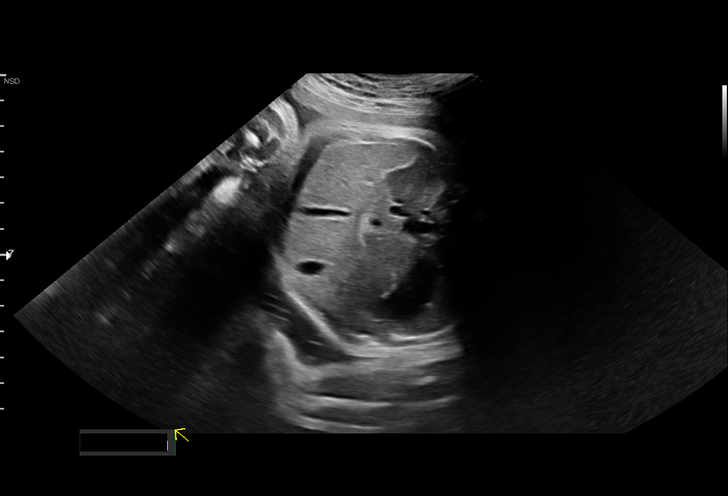
[im 18/27]
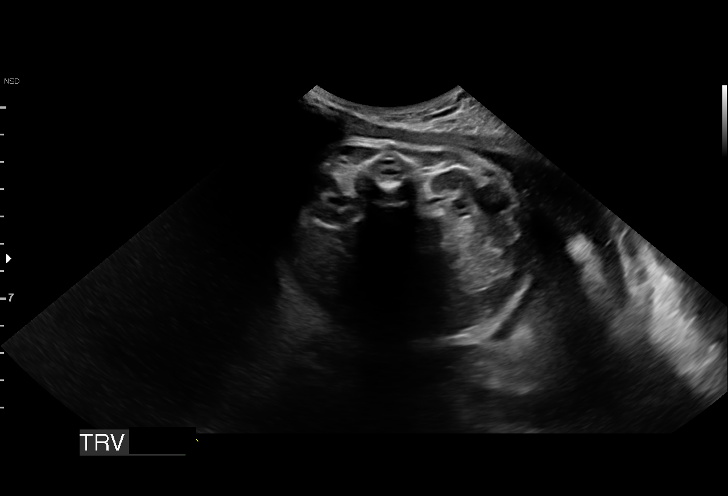
[im 20/27]
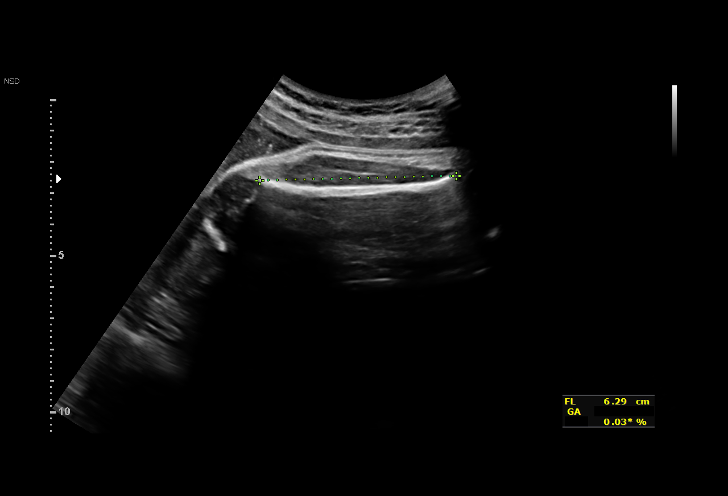
[im 22/27]
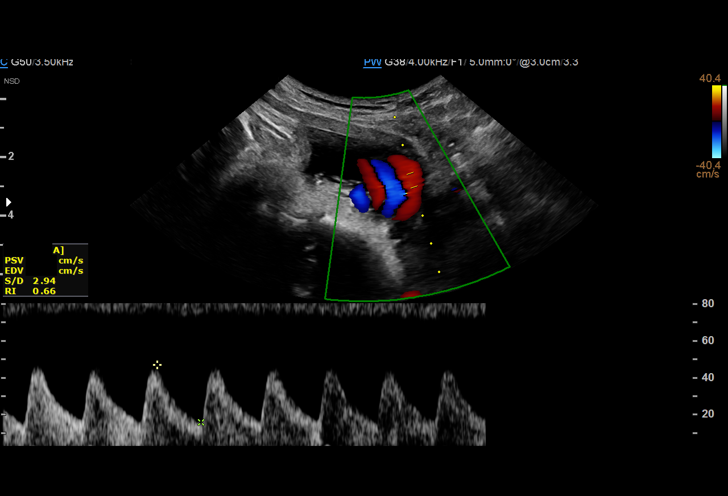
[im 24/27]
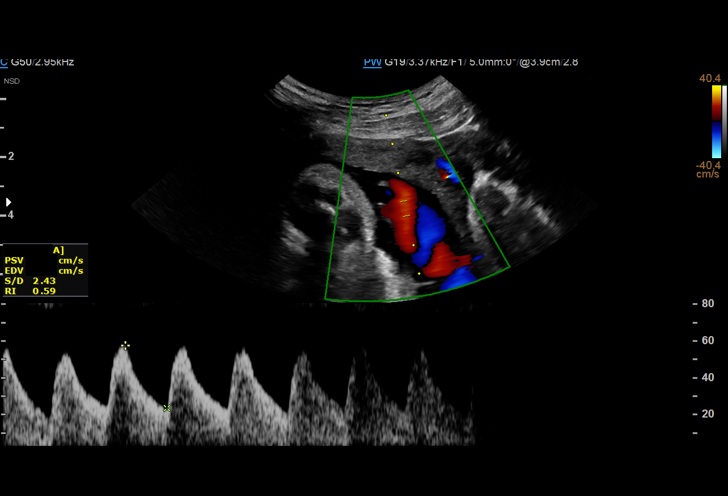
[im 26/27]
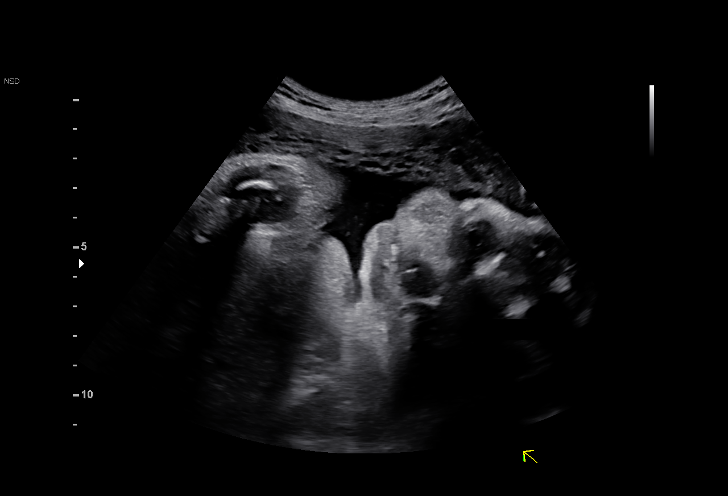

[13 of 27 positions shown; findings below may reference images not displayed]

W/NONSTRESS
 ----------------------------------------------------------------------

 ----------------------------------------------------------------------
Indications

  Maternal care for known or suspected poor
  fetal growth, third trimester, not applicable or
  unspecified
  37 weeks gestation of pregnancy
  Poor obstetric history: Previous preterm
  delivery, antepartum (@34 weeks)
  Insufficient Prenatal Care
  Fetal abnormality - other known or
  suspected (EIFLV)
 ----------------------------------------------------------------------
Vital Signs

                                                Height:        5'10"
Fetal Evaluation

 Num Of Fetuses:         1
 Cardiac Activity:       Observed
 Presentation:           Cephalic
 Placenta:               Posterior
 P. Cord Insertion:      Previously Visualized

 Amniotic Fluid
 AFI FV:      Within normal limits

 AFI Sum(cm)     %Tile       Largest Pocket(cm)
 18.4            71

 RUQ(cm)       RLQ(cm)       LUQ(cm)        LLQ(cm)

Biophysical Evaluation

 Amniotic F.V:   Within normal limits       F. Tone:        Observed
 F. Movement:    Observed                   N.S.T:          Reactive
 F. Breathing:   Observed                   Score:          [DATE]
Biometry

 BPD:      85.3  mm     G. Age:  34w 2d          3  %    CI:        80.39   %    70 - 86
                                                         FL/HC:      21.3   %    20.9 -
 HC:      300.5  mm     G. Age:  33w 2d        < 3  %    HC/AC:      0.99        0.92 -
 AC:      303.9  mm     G. Age:  34w 3d        < 3  %    FL/BPD:     74.9   %    71 - 87
 FL:       63.9  mm     G. Age:  33w 0d        < 3  %    FL/AC:      21.0   %    20 - 24
 HUM:      57.2  mm     G. Age:  33w 1d        < 5  %

 Est. FW:    4443  gm      5 lb 1 oz   < 10  %
OB History

 Gravidity:    6         Term:   3        Prem:   1        SAB:   2
 TOP:          0       Ectopic:  0        Living: 3
Gestational Age

 LMP:           37w 4d        Date:  05/25/18                 EDD:   03/01/19
 U/S Today:     33w 5d                                        EDD:   03/28/19
 Best:          37w 4d     Det. By:  LMP  (05/25/18)          EDD:   03/01/19
Anatomy

 Cranium:               Appears normal         Aortic Arch:            Previously seen
 Cavum:                 Previously seen        Ductal Arch:            Previously seen
 Ventricles:            Previously seen        Diaphragm:              Previously seen
 Choroid Plexus:        Previously seen        Stomach:                Appears normal, left
                                                                       sided
 Cerebellum:            Previously seen        Abdomen:                Appears normal
 Posterior Fossa:       Previously seen        Abdominal Wall:         Previously seen
 Nuchal Fold:           Previously seen        Cord Vessels:           Previously seen
 Face:                  Orbits and profile     Kidneys:                Appear normal
                        previously seen
 Lips:                  Previously seen        Bladder:                Appears normal
 Thoracic:              Appears normal         Spine:                  Previously seen
 Heart:                 Appears normal         Upper Extremities:      Previously seen
                        (4CH, axis, and
                        situs)
 RVOT:                  Previously seen        Lower Extremities:      Previously seen
 LVOT:                  Previously seen

 Other:  Heels and 5th digit prev visualized. Technically difficult due to fetal
         position.
Doppler - Fetal Vessels

 Umbilical Artery
  S/D     %tile
 2.65       67

Impression

 The estimated fetal weight is at less than the 10th percentile,
 but good interval growth is seen. Amniotic fluid is normal and
 good fetal activity is seen. Antenatal testing is reassuring.
 Umbilical artery Doppler showed normal forward diastolic
 flow. NST is reactive. BPP [DATE].

 We discussed timing of delivery. It is reasonable to deliver
 between 38 and 40 weeks. I recommended 39 weeks.
Recommendations

 -Antenatal testing next week.
                 Ackermann, [place]

## 2019-09-01 DIAGNOSIS — Z5181 Encounter for therapeutic drug level monitoring: Secondary | ICD-10-CM | POA: Diagnosis not present

## 2019-09-23 DIAGNOSIS — Z5181 Encounter for therapeutic drug level monitoring: Secondary | ICD-10-CM | POA: Diagnosis not present

## 2019-09-30 DIAGNOSIS — Z5181 Encounter for therapeutic drug level monitoring: Secondary | ICD-10-CM | POA: Diagnosis not present

## 2019-10-02 ENCOUNTER — Other Ambulatory Visit: Payer: Self-pay

## 2019-10-02 ENCOUNTER — Encounter (HOSPITAL_COMMUNITY): Payer: Self-pay | Admitting: Radiology

## 2019-10-02 ENCOUNTER — Emergency Department (HOSPITAL_COMMUNITY)
Admission: EM | Admit: 2019-10-02 | Discharge: 2019-10-02 | Disposition: A | Discharge status: Home / Self Care | Payer: Medicaid Other | Attending: Emergency Medicine | Admitting: Emergency Medicine

## 2019-10-02 ENCOUNTER — Emergency Department (HOSPITAL_COMMUNITY): Payer: Medicaid Other

## 2019-10-02 DIAGNOSIS — S99921A Unspecified injury of right foot, initial encounter: Secondary | ICD-10-CM | POA: Diagnosis not present

## 2019-10-02 DIAGNOSIS — R52 Pain, unspecified: Secondary | ICD-10-CM | POA: Diagnosis not present

## 2019-10-02 DIAGNOSIS — Y929 Unspecified place or not applicable: Secondary | ICD-10-CM | POA: Diagnosis not present

## 2019-10-02 DIAGNOSIS — Y9389 Activity, other specified: Secondary | ICD-10-CM | POA: Diagnosis not present

## 2019-10-02 DIAGNOSIS — S9031XA Contusion of right foot, initial encounter: Secondary | ICD-10-CM | POA: Diagnosis not present

## 2019-10-02 DIAGNOSIS — Y999 Unspecified external cause status: Secondary | ICD-10-CM | POA: Diagnosis not present

## 2019-10-02 DIAGNOSIS — Y29XXXA Contact with blunt object, undetermined intent, initial encounter: Secondary | ICD-10-CM | POA: Insufficient documentation

## 2019-10-02 MED ORDER — HYDROCODONE-ACETAMINOPHEN 5-325 MG PO TABS
1.0000 | ORAL_TABLET | Freq: Once | ORAL | Status: AC
  Administered 2019-10-02: 1 via ORAL
  Filled 2019-10-02: qty 1

## 2019-10-02 NOTE — ED Provider Notes (Signed)
[place] DEPT Provider Note  CSN: [id number] Arrival date & time: 10/02/19 0126  Chief Complaint(s) No chief complaint on file.  HPI Mandy Vasquez is a 29 y.o. female   The history is provided by the patient.  Foot Injury Location:  Foot Time since incident:  6 hours Injury: yes   Foot location:  R foot Pain details:    Quality:  Aching and throbbing   Severity:  Severe   Onset quality:  Sudden   Timing:  Constant   Progression:  Unchanged Chronicity:  New Worsened by:  Bearing weight Ineffective treatments:  None tried  She reports kicking a wall after turning around to walk out of a room.  Past Medical History Past Medical History:  Diagnosis Date  . Anxiety    Patient Active Problem List   Diagnosis Date Noted  . Fetal growth retardation, antepartum 02/22/2019  . History of preterm delivery, currently pregnant 04/02/2017   Home Medication(s) Prior to Admission medications   Medication Sig Start Date End Date Taking? Authorizing Provider  acetaminophen (TYLENOL) 325 MG tablet Take 2 tablets (650 mg total) by mouth every 4 (four) hours as needed (for pain scale < 4). Patient not taking: Reported on 04/14/2019 02/23/19   Darlina Rumpf, CNM  benzocaine-Menthol (DERMOPLAST) 20-0.5 % AERO Apply 1 application topically as needed for irritation (perineal discomfort). Patient not taking: Reported on 04/14/2019 02/23/19   Darlina Rumpf, CNM  medroxyPROGESTERone (DEPO-PROVERA) 150 MG/ML injection Inject 1 mL (150 mg total) into the muscle every 3 (three) months. 04/14/19   Shelly Bombard, MD  Prenatal Vit-Fe Fumarate-FA (PREPLUS) 27-1 MG TABS Take 1 tablet by mouth daily. Patient not taking: Reported on 04/14/2019 02/23/19   Darlina Rumpf, CNM  witch hazel-glycerin (TUCKS) pad Apply 1 application topically as needed for hemorrhoids. Patient not taking: Reported on 04/14/2019 02/23/19   Darlina Rumpf, CNM                                                                                                                   Past Surgical History Past Surgical History:  Procedure Laterality Date  . NO PAST SURGERIES     Family History Family History  Problem Relation Age of Onset  . Diabetes Maternal Aunt   . Heart disease Maternal Aunt   . Diabetes Maternal Grandmother   . Heart disease Maternal Grandmother     Social History Social History   Tobacco Use  . Smoking status: Never Smoker  . Smokeless tobacco: Never Used  Substance Use Topics  . Alcohol use: No  . Drug use: Yes    Frequency: 2.0 times per week    Types: Marijuana    Comment: not used in 1 month   Allergies Latex and Onion  Review of Systems Review of Systems As noted in HPI Physical Exam Vital Signs  I have reviewed the triage vital signs There were no vitals taken for this visit.  Physical Exam Vitals reviewed.  Constitutional:  General: She is not in acute distress.    Appearance: She is well-developed. She is not diaphoretic.  HENT:     Head: Normocephalic and atraumatic.     Right Ear: External ear normal.     Left Ear: External ear normal.     Nose: Nose normal.  Eyes:     General: No scleral icterus.    Conjunctiva/sclera: Conjunctivae normal.  Neck:     Trachea: Phonation normal.  Cardiovascular:     Rate and Rhythm: Normal rate and regular rhythm.  Pulmonary:     Effort: Pulmonary effort is normal. No respiratory distress.     Breath sounds: No stridor.  Abdominal:     General: There is no distension.  Musculoskeletal:        General: Normal range of motion.     Cervical back: Normal range of motion.     Right ankle: No tenderness.     Left ankle: No tenderness.     Right foot: Normal range of motion and normal capillary refill. Tenderness present. No swelling or deformity. Normal pulse.     Left foot: Normal range of motion and normal capillary refill. No swelling, deformity or  tenderness. Normal pulse.  Neurological:     Mental Status: She is alert and oriented to person, place, and time.  Psychiatric:        Behavior: Behavior normal.     ED Results and Treatments Labs (all labs ordered are listed, but only abnormal results are displayed) Labs Reviewed - No data to display                                                                                                                       EKG  EKG Interpretation  Date/Time:    Ventricular Rate:    PR Interval:    QRS Duration:   QT Interval:    QTC Calculation:   R Axis:     Text Interpretation:        Radiology DG Foot Complete Right  Result Date: 10/02/2019 CLINICAL DATA:  Patient hit foot on table EXAM: RIGHT FOOT COMPLETE - 3+ VIEW COMPARISON:  None. FINDINGS: There is no evidence of fracture or dislocation. There is no evidence of arthropathy or other focal bone abnormality. Soft tissues are unremarkable. IMPRESSION: Negative. Electronically Signed   By: Jonna Clark M.D.   On: 10/02/2019 02:59    Pertinent labs & imaging results that were available during my care of the patient were reviewed by me and considered in my medical decision making (see chart for details).  Medications Ordered in ED Medications  HYDROcodone-acetaminophen (NORCO/VICODIN) 5-325 MG per tablet 1 tablet (1 tablet Oral Given 10/02/19 0215)  Procedures Procedures  (including critical care time)  Medical Decision Making / ED Course I have reviewed the nursing notes for this encounter and the patient's prior records (if available in EHR or on provided paperwork).   Mandy Vasquez was evaluated in Emergency Department on 10/02/2019 for the symptoms described in the history of present illness. She was evaluated in the context of the global COVID-19 pandemic, which necessitated consideration  that the patient might be at risk for infection with the SARS-CoV-2 virus that causes COVID-19. Institutional protocols and algorithms that pertain to the evaluation of patients at risk for COVID-19 are in a state of rapid change based on information released by regulatory bodies including the CDC and federal and state organizations. These policies and algorithms were followed during the patient's care in the ED.  Provided with Ice and oral norco. Xray negative.  The patient appears reasonably screened and/or stabilized for discharge and I doubt any other medical condition or other [facility] requiring further screening, evaluation, or treatment in the ED at this time prior to discharge.  The patient is safe for discharge with strict return precautions.       Final Clinical Impression(s) / ED Diagnoses Final diagnoses:  Contusion of right foot, initial encounter    The patient appears reasonably screened and/or stabilized for discharge and I doubt any other medical condition or other [facility] requiring further screening, evaluation, or treatment in the ED at this time prior to discharge.  Disposition: Discharge  Condition: Good  I have discussed the results, Dx and Tx plan with the patient who expressed understanding and agree(s) with the plan. Discharge instructions discussed at great length. The patient was given strict return precautions who verbalized understanding of the instructions. No further questions at time of discharge.    ED Discharge Orders    None       Follow Up: Primary care provider  Schedule an appointment as soon as possible for a visit  If you do not have a primary care physician, contact [facility] at [phone] for referral      This chart was dictated using voice recognition software.  Despite best efforts to proofread,  errors can occur which can change the documentation meaning.   Nira Conn, MD 10/02/19 [phone]

## 2019-10-07 DIAGNOSIS — Z5181 Encounter for therapeutic drug level monitoring: Secondary | ICD-10-CM | POA: Diagnosis not present

## 2019-10-14 DIAGNOSIS — Z5181 Encounter for therapeutic drug level monitoring: Secondary | ICD-10-CM | POA: Diagnosis not present

## 2019-10-21 DIAGNOSIS — Z5181 Encounter for therapeutic drug level monitoring: Secondary | ICD-10-CM | POA: Diagnosis not present

## 2019-10-28 DIAGNOSIS — Z5181 Encounter for therapeutic drug level monitoring: Secondary | ICD-10-CM | POA: Diagnosis not present

## 2019-11-05 ENCOUNTER — Ambulatory Visit (INDEPENDENT_AMBULATORY_CARE_PROVIDER_SITE_OTHER): Payer: Medicaid Other

## 2019-11-05 ENCOUNTER — Other Ambulatory Visit: Payer: Self-pay

## 2019-11-05 DIAGNOSIS — Z3201 Encounter for pregnancy test, result positive: Secondary | ICD-10-CM | POA: Diagnosis not present

## 2019-11-05 DIAGNOSIS — Z5181 Encounter for therapeutic drug level monitoring: Secondary | ICD-10-CM | POA: Diagnosis not present

## 2019-11-05 DIAGNOSIS — Z3200 Encounter for pregnancy test, result unknown: Secondary | ICD-10-CM

## 2019-11-05 DIAGNOSIS — O09899 Supervision of other high risk pregnancies, unspecified trimester: Secondary | ICD-10-CM | POA: Insufficient documentation

## 2019-11-05 DIAGNOSIS — Z3400 Encounter for supervision of normal first pregnancy, unspecified trimester: Secondary | ICD-10-CM | POA: Diagnosis not present

## 2019-11-05 LAB — POCT URINE PREGNANCY: Preg Test, Ur: POSITIVE — AB

## 2019-11-05 MED ORDER — BLOOD PRESSURE KIT DEVI: 1.0000 | 0 refills | Status: AC | PRN

## 2019-11-05 NOTE — Progress Notes (Signed)
..    New OB Intake   Location:[facility] Patient: Mandy Vasquez   I discussed the limitations, risks, security and privacy concerns of performing an evaluation and management service by telephone and the availability of in person appointments. I also discussed with the patient that there may be a patient responsible charge related to this service. The patient expressed understanding and agreed to proceed.   History of Present Illness: PRENATAL INTAKE SUMMARY  Mandy Vasquez presents today New OB Nurse Interview.  OB History    Gravida  7   Para  4   Term  3   Preterm  1   AB  2   Living  4     SAB  2   TAB      Ectopic      Multiple  0   Live Births  4          I have reviewed the patient's medical, obstetrical, social, and family histories, medications, and available lab results.  SUBJECTIVE She has no unusual complaints   Observations/Objective: Initial nurse interview for history/labs (New OB)  EDD: 06-06-20 GA: [redacted]w[redacted]d GP: Larson.Ratel  GENERAL APPEARANCE: alert, well appearing  Assessment and Plan: Normal pregnancy Pt reports hx of preterm delivery at 34 weeks. Pt has previously been on 17-p injections but states that she stopped them and does not want to do it. [facility].  Follow Up Instructions:   I discussed the assessment and treatment plan with the patient. The patient was provided an opportunity to ask questions and all were answered. The patient agreed with the plan and demonstrated an understanding of the instructions.   The patient was advised to call back or seek an in-person evaluation if the symptoms worsen or if the condition fails to improve as anticipated.  I provided 15 minutes of face-to-face time during this encounter.   Katrina Stack, RN

## 2019-11-05 NOTE — Progress Notes (Signed)
Patient ID: Mandy Vasquez, female   DOB: 05/28/1991, 28 y.o.   MRN: [id number] Patient seen and assessed by nursing staff during this encounter. I have reviewed the chart and agree with the documentation and plan.  Scheryl Darter, MD 11/05/2019 2:35 PM

## 2019-11-25 DIAGNOSIS — Z5181 Encounter for therapeutic drug level monitoring: Secondary | ICD-10-CM | POA: Diagnosis not present

## 2019-11-26 ENCOUNTER — Encounter: Payer: Medicaid Other | Admitting: Obstetrics

## 2019-12-03 DIAGNOSIS — Z5181 Encounter for therapeutic drug level monitoring: Secondary | ICD-10-CM | POA: Diagnosis not present

## 2019-12-08 ENCOUNTER — Other Ambulatory Visit: Payer: Self-pay

## 2019-12-08 ENCOUNTER — Encounter (HOSPITAL_COMMUNITY): Payer: Self-pay | Admitting: Emergency Medicine

## 2019-12-08 ENCOUNTER — Inpatient Hospital Stay (HOSPITAL_COMMUNITY): Payer: Medicaid Other

## 2019-12-08 ENCOUNTER — Inpatient Hospital Stay (HOSPITAL_COMMUNITY)
Admission: EM | Admit: 2019-12-08 | Discharge: 2019-12-08 | Disposition: A | Discharge status: Home / Self Care | Payer: Medicaid Other | Attending: Obstetrics and Gynecology | Admitting: Obstetrics and Gynecology

## 2019-12-08 DIAGNOSIS — Z3490 Encounter for supervision of normal pregnancy, unspecified, unspecified trimester: Secondary | ICD-10-CM

## 2019-12-08 DIAGNOSIS — O26892 Other specified pregnancy related conditions, second trimester: Secondary | ICD-10-CM | POA: Diagnosis not present

## 2019-12-08 DIAGNOSIS — R109 Unspecified abdominal pain: Secondary | ICD-10-CM

## 2019-12-08 DIAGNOSIS — G43909 Migraine, unspecified, not intractable, without status migrainosus: Secondary | ICD-10-CM | POA: Diagnosis not present

## 2019-12-08 DIAGNOSIS — R1030 Lower abdominal pain, unspecified: Secondary | ICD-10-CM | POA: Insufficient documentation

## 2019-12-08 DIAGNOSIS — Z3A14 14 weeks gestation of pregnancy: Secondary | ICD-10-CM | POA: Insufficient documentation

## 2019-12-08 DIAGNOSIS — K0889 Other specified disorders of teeth and supporting structures: Secondary | ICD-10-CM

## 2019-12-08 DIAGNOSIS — R1031 Right lower quadrant pain: Secondary | ICD-10-CM | POA: Diagnosis not present

## 2019-12-08 LAB — URINALYSIS, ROUTINE W REFLEX MICROSCOPIC
Glucose, UA: NEGATIVE mg/dL
Hgb urine dipstick: NEGATIVE
Ketones, ur: 5 mg/dL — AB
Nitrite: NEGATIVE
Protein, ur: 100 mg/dL — AB
Specific Gravity, Urine: 1.039 — ABNORMAL HIGH (ref 1.005–1.030)
pH: 5 (ref 5.0–8.0)

## 2019-12-08 LAB — CBC WITH DIFFERENTIAL/PLATELET
Abs Immature Granulocytes: 0.02 10*3/uL (ref 0.00–0.07)
Basophils Absolute: 0 10*3/uL (ref 0.0–0.1)
Basophils Relative: 1 %
Eosinophils Absolute: 0.2 10*3/uL (ref 0.0–0.5)
Eosinophils Relative: 2 %
HCT: 32.8 % — ABNORMAL LOW (ref 36.0–46.0)
Hemoglobin: 10.5 g/dL — ABNORMAL LOW (ref 12.0–15.0)
Immature Granulocytes: 0 %
Lymphocytes Relative: 37 %
Lymphs Abs: 2.8 10*3/uL (ref 0.7–4.0)
MCH: 28.5 pg (ref 26.0–34.0)
MCHC: 32 g/dL (ref 30.0–36.0)
MCV: 88.9 fL (ref 80.0–100.0)
Monocytes Absolute: 0.5 10*3/uL (ref 0.1–1.0)
Monocytes Relative: 6 %
Neutro Abs: 4.1 10*3/uL (ref 1.7–7.7)
Neutrophils Relative %: 54 %
Platelets: 231 10*3/uL (ref 150–400)
RBC: 3.69 MIL/uL — ABNORMAL LOW (ref 3.87–5.11)
RDW: 13.8 % (ref 11.5–15.5)
WBC: 7.6 10*3/uL (ref 4.0–10.5)
nRBC: 0 % (ref 0.0–0.2)

## 2019-12-08 LAB — WET PREP, GENITAL
Clue Cells Wet Prep HPF POC: NONE SEEN
Sperm: NONE SEEN
Trich, Wet Prep: NONE SEEN
Yeast Wet Prep HPF POC: NONE SEEN

## 2019-12-08 LAB — BASIC METABOLIC PANEL
Anion gap: 9 (ref 5–15)
BUN: 9 mg/dL (ref 6–20)
CO2: 21 mmol/L — ABNORMAL LOW (ref 22–32)
Calcium: 8.8 mg/dL — ABNORMAL LOW (ref 8.9–10.3)
Chloride: 106 mmol/L (ref 98–111)
Creatinine, Ser: 0.57 mg/dL (ref 0.44–1.00)
GFR calc Af Amer: >60 mL/min (ref >60)
GFR calc non Af Amer: >60 mL/min (ref >60)
Glucose, Bld: 93 mg/dL (ref 70–99)
Potassium: 3.5 mmol/L (ref 3.5–5.1)
Sodium: 136 mmol/L (ref 135–145)

## 2019-12-08 LAB — I-STAT BETA HCG BLOOD, ED (MC, WL, AP ONLY): I-stat hCG, quantitative: >2000 m[IU]/mL — ABNORMAL HIGH (ref <5)

## 2019-12-08 LAB — HCG, QUANTITATIVE, PREGNANCY: hCG, Beta Chain, Quant, S: 27777 m[IU]/mL — ABNORMAL HIGH (ref <5)

## 2019-12-08 LAB — HEPATIC FUNCTION PANEL
ALT: 9 U/L (ref 0–44)
AST: 14 U/L — ABNORMAL LOW (ref 15–41)
Albumin: 3.2 g/dL — ABNORMAL LOW (ref 3.5–5.0)
Alkaline Phosphatase: 50 U/L (ref 38–126)
Bilirubin, Direct: <0.1 mg/dL (ref 0.0–0.2)
Total Bilirubin: 0.5 mg/dL (ref 0.3–1.2)
Total Protein: 6.2 g/dL — ABNORMAL LOW (ref 6.5–8.1)

## 2019-12-08 MED ORDER — AMOXICILLIN-POT CLAVULANATE 875-125 MG PO TABS: 1.0000 | ORAL_TABLET | Freq: Two times a day (BID) | ORAL | 0 refills | Status: AC

## 2019-12-08 MED ORDER — DIPHENHYDRAMINE HCL 50 MG/ML IJ SOLN
25.0000 mg | Freq: Once | INTRAMUSCULAR | Status: AC
  Administered 2019-12-08: 08:00:00 25 mg via INTRAVENOUS
  Filled 2019-12-08: qty 1

## 2019-12-08 MED ORDER — METOCLOPRAMIDE HCL 5 MG/ML IJ SOLN
10.0000 mg | Freq: Once | INTRAMUSCULAR | Status: AC
  Administered 2019-12-08: 10 mg via INTRAVENOUS
  Filled 2019-12-08: qty 2

## 2019-12-08 MED ORDER — ACETAMINOPHEN 325 MG PO TABS
650.0000 mg | ORAL_TABLET | Freq: Once | ORAL | Status: AC
  Administered 2019-12-08: 650 mg via ORAL
  Filled 2019-12-08: qty 2

## 2019-12-08 MED ORDER — PROCHLORPERAZINE EDISYLATE 10 MG/2ML IJ SOLN: 10.0000 mg | Freq: Once | INTRAMUSCULAR | Status: DC

## 2019-12-08 MED ORDER — DEXAMETHASONE SODIUM PHOSPHATE 10 MG/ML IJ SOLN
10.0000 mg | Freq: Once | INTRAMUSCULAR | Status: AC
  Administered 2019-12-08: 08:00:00 10 mg via INTRAVENOUS
  Filled 2019-12-08: qty 1

## 2019-12-08 NOTE — ED Provider Notes (Signed)
[place] EMERGENCY DEPARTMENT Provider Note   CSN: [id number] Arrival date & time: 12/08/19  0020     History Chief Complaint  Patient presents with  . Migraine / Tooth Ache  . Abdominal Pain    Mandy Vasquez is a 29 y.o. female, G7P4, who presents to the emergency department with multiple complaints.  Her primary complaint is that she has had a diffuse headache that has been intermittent for the last 3 days.  She reports that the headache will resolve when she closes her eyes, but the headache returns that she knows she opens them.  Reports that she has a longstanding history of headaches over the last year.  Headache today is similar to her typical headaches.  No treatment prior to arrival.  She reports that she has been having nausea and vomiting, but is unsure if this is related to her headache early her pregnancy.  She reports that she was seen by her OB on 2/17 and had a positive urinary pregnancy test in the office.  She is unsure when her last menstrual period was.  She has not had a pelvic ultrasound since the positive pregnancy test and reports that she is unable to get into see her OB/GYN until April.  Last week, she reports that she had vaginal bleeding for 1 day, but she has been having bilateral lower abdominal and back pain that has been constant and gradually worsening over the last few weeks.   She also reports that she has been having some right-sided upper dental pain for the last 4 months.  She reports that the pain worsened earlier today when she attempted to take a bite of a cheeseburger.  She denies facial or neck swelling, trismus, sore throat, purulent drainage, fever, or chills.  She has not established with a dentist.  She also expresses concerns that her apartment may have black mold.  She is requesting testing to see if she is allergic to mold.  She reports that she has worsening coughing and will sometimes sneeze so frequently that she will  develop a nosebleed.  No other URI symptoms.  She notes that the symptoms seem to be worse in her bathroom.  She denies respiratory distress, chest pain, hemoptysis.   The history is provided by the patient. No language interpreter was used.       Past Medical History:  Diagnosis Date  . Anxiety     Patient Active Problem List   Diagnosis Date Noted  . Supervision of other high risk pregnancy, antepartum 11/05/2019  . Fetal growth retardation, antepartum 02/22/2019  . History of preterm delivery, currently pregnant 04/02/2017    Past Surgical History:  Procedure Laterality Date  . NO PAST SURGERIES       OB History    Gravida  7   Para  4   Term  3   Preterm  1   AB  2   Living  4     SAB  2   TAB      Ectopic      Multiple  0   Live Births  4           Family History  Problem Relation Age of Onset  . Diabetes Maternal Aunt   . Heart disease Maternal Aunt   . Diabetes Maternal Grandmother   . Heart disease Maternal Grandmother     Social History   Tobacco Use  . Smoking status: Never Smoker  . Smokeless  tobacco: Never Used  Substance Use Topics  . Alcohol use: No  . Drug use: Yes    Frequency: 2.0 times per week    Types: Marijuana    Comment: not used in 1 month    Home Medications Prior to Admission medications   Medication Sig Start Date End Date Taking? Authorizing Provider  acetaminophen (TYLENOL) 325 MG tablet Take 2 tablets (650 mg total) by mouth every 4 (four) hours as needed (for pain scale < 4). Patient not taking: Reported on 04/14/2019 02/23/19   Darlina Rumpf, CNM  benzocaine-Menthol (DERMOPLAST) 20-0.5 % AERO Apply 1 application topically as needed for irritation (perineal discomfort). Patient not taking: Reported on 04/14/2019 02/23/19   Darlina Rumpf, CNM  Blood Pressure Monitoring (BLOOD PRESSURE KIT) DEVI 1 kit by Does not apply route as needed. 11/05/19   Woodroe Mode, MD  medroxyPROGESTERone  (DEPO-PROVERA) 150 MG/ML injection Inject 1 mL (150 mg total) into the muscle every 3 (three) months. Patient not taking: Reported on 11/05/2019 04/14/19   Shelly Bombard, MD  Prenatal Vit-Fe Fumarate-FA (PREPLUS) 27-1 MG TABS Take 1 tablet by mouth daily. Patient not taking: Reported on 12/08/2019 02/23/19   Darlina Rumpf, CNM  witch hazel-glycerin (TUCKS) pad Apply 1 application topically as needed for hemorrhoids. Patient not taking: Reported on 04/14/2019 02/23/19   Darlina Rumpf, CNM    Allergies    Latex and Onion  Review of Systems   Review of Systems  Constitutional: Negative for activity change, chills and fever.  HENT: Positive for dental problem and nosebleeds. Negative for congestion, ear discharge, ear pain, facial swelling, sinus pressure, sinus pain, sore throat and voice change.   Eyes: Negative for visual disturbance.  Respiratory: Positive for cough. Negative for shortness of breath and wheezing.   Cardiovascular: Negative for chest pain, palpitations and leg swelling.  Gastrointestinal: Positive for abdominal pain, nausea and vomiting. Negative for constipation and diarrhea.  Genitourinary: Positive for vaginal bleeding (resolved). Negative for dysuria, urgency, vaginal discharge and vaginal pain.  Musculoskeletal: Positive for back pain.  Skin: Negative for rash.  Allergic/Immunologic: Negative for immunocompromised state.  Neurological: Positive for headaches. Negative for dizziness, syncope, speech difficulty, weakness, light-headedness and numbness.  Psychiatric/Behavioral: Negative for confusion.    Physical Exam Updated Vital Signs BP 103/66 (BP Location: Left Arm)   Pulse (!) 50   Temp 98.8 F (37.1 C) (Oral)   Resp 16   Ht 5' 10"  (1.778 m)   Wt 64.7 kg   LMP 08/31/2019   SpO2 100%   BMI 20.48 kg/m   Physical Exam Vitals and nursing note reviewed.  Constitutional:      General: She is not in acute distress.    Appearance: She is not  diaphoretic.  HENT:     Head: Normocephalic.     Right Ear: Tympanic membrane, ear canal and external ear normal.     Left Ear: Tympanic membrane, ear canal and external ear normal.     Nose: Nose normal.     Mouth/Throat:     Mouth: Mucous membranes are moist.     Dentition: Dental caries present.     Pharynx: No oropharyngeal exudate or posterior oropharyngeal erythema.     Comments: Poor dentition throughout.  The right maxillary second molar is fractured and tender to palpation.  No dental abscess. Eyes:     Conjunctiva/sclera: Conjunctivae normal.  Cardiovascular:     Rate and Rhythm: Normal rate and regular rhythm.  Heart sounds: No murmur. No friction rub. No gallop.   Pulmonary:     Effort: Pulmonary effort is normal. No respiratory distress.     Breath sounds: No stridor. No wheezing, rhonchi or rales.  Chest:     Chest wall: No tenderness.  Abdominal:     General: There is no distension.     Palpations: Abdomen is soft. There is no mass.     Tenderness: There is abdominal tenderness. There is no right CVA tenderness, left CVA tenderness, guarding or rebound.     Hernia: No hernia is present.     Comments: Abdomen is gravid.  Diffusely tender to the bilateral lower abdomen.  No rebound or guarding.  No CVA tenderness bilaterally.  Musculoskeletal:     Cervical back: Neck supple.     Comments: No tenderness to the cervical, thoracic, or lumbar spinous processes or bilateral paraspinal muscles.  Skin:    General: Skin is warm.     Findings: No rash.  Neurological:     General: No focal deficit present.     Mental Status: She is alert and oriented to person, place, and time.     Cranial Nerves: No cranial nerve deficit.     Sensory: No sensory deficit.     Motor: No weakness.     Coordination: Coordination normal.     Gait: Gait normal.  Psychiatric:        Behavior: Behavior normal.     ED Results / Procedures / Treatments   Labs (all labs ordered are listed,  but only abnormal results are displayed) Labs Reviewed  CBC WITH DIFFERENTIAL/PLATELET - Abnormal; Notable for the following components:      Result Value   RBC 3.69 (*)    Hemoglobin 10.5 (*)    HCT 32.8 (*)    All other components within normal limits  BASIC METABOLIC PANEL - Abnormal; Notable for the following components:   CO2 21 (*)    Calcium 8.8 (*)    All other components within normal limits  I-STAT BETA HCG BLOOD, ED ([facility], [facility], [facility] ONLY) - Abnormal; Notable for the following components:   I-stat hCG, quantitative >2,000.0 (*)    All other components within normal limits  HCG, QUANTITATIVE, PREGNANCY  URINALYSIS, ROUTINE W REFLEX MICROSCOPIC    EKG None  Radiology No results found.  Procedures Procedures (including critical care time)  Medications Ordered in ED Medications  metoCLOPramide (REGLAN) injection 10 mg (10 mg Intravenous Given 12/08/19 0611)  acetaminophen (TYLENOL) tablet 650 mg (650 mg Oral Given 12/08/19 6433)    ED Course  I have reviewed the triage vital signs and the nursing notes.  Pertinent labs & imaging results that were available during my care of the patient were reviewed by me and considered in my medical decision making (see chart for details).    MDM Rules/Calculators/A&P                      29 year old G64P4 female with no chronic medical conditions presenting with multiple complaints.  She has had a diffuse headache for the last 3 days.  No focal neurologic deficits.  She has a history of chronic headaches for the last year and current headache is unchanged.  No treatment prior to arrival.  She was given Reglan and Tylenol and on reevaluation headache have resolved.  Doubt CVA, meningitis, or COVID-19.  She also reports that she has been having right upper dental pain for the  last 4 months.  Pain worsened earlier today after biting into a cheeseburger.  On exam she is tender to palpation to the right maxillary second molar.  No dental  abscess.  However, since she is pregnant, she would benefit from starting on antibiotics for prophylaxis.  She also presents with lower abdominal and back pain for the last few weeks.  She had 1 day of vaginal bleeding last week which is since resolved.  She has been seen by her OB/GYN and had a confirmed urinary pregnancy test, but has not had a pelvic ultrasound.  Spoke with the MAU clinician who recommends repeating an i-STAT hCG.  If positive, she is agreeable to transfer for pelvic ultrasound.  If negative, patient was likely miscarriage last week.  I-STAT hCG is positive.  Quantitative hCG is pending.  Will transfer to MAU for further work-up and evaluation. MAU clinician can discharge patient for prophylactic antibiotics for dental pain at time of discharge since work-up is still in process.  Final Clinical Impression(s) / ED Diagnoses Final diagnoses:  None    Rx / DC Orders ED Discharge Orders    None       Joanne Gavel, PA-C 12/08/19 [id number]    Orpah Greek, MD 12/09/19 0425

## 2019-12-08 NOTE — Discharge Instructions (Signed)
You have a urine culture pending results. Someone will call you, if you need to take any additional antibiotics.

## 2019-12-08 NOTE — MAU Note (Signed)
Patient seen in main ED for toothache and migraine, but told the nurse that she is pregnant and had heavy bleeding last week and hadn't had an ultrasound.  Denies vaginal bleeding today.  Endorses lower abdominal cramping and back pain.

## 2019-12-08 NOTE — MAU Provider Note (Addendum)
History     CSN: [id number]  Arrival date and time: 12/08/19 0020   First Provider Initiated Contact with Patient 12/08/19 [phone]      Chief Complaint  Patient presents with  . Migraine / Tooth Ache  . Abdominal Pain   HPI  Patient is a 29 year old, [redacted]w[redacted]d [id number] presenting today with a migraine, tooth pain, and abdominal cramping. [facility] and was sent to MAU for an U/S. States her migraine is 6/10 now, was a 10/10 at the ED. States she had vaginal bleeding last Wednesday for one day, states the amount was similar to her period. Bleeding lasted one day. Last Wednesday pt also started having cramping, dysuria, frequency, and urgency ever since.Patient states the cramping is in the lower abdomen bilateral, radiating to bilateral flanks. Pt had sexual intercourse 3 days ago with same partner of 8 years. Denies LOF and vomiting.  OB History     Gravida  7   Para  4   Term  3   Preterm  1   AB  2   Living  4      SAB  2   TAB      Ectopic      Multiple  0   Live Births  4           Past Medical History:  Diagnosis Date  . Anxiety     Past Surgical History:  Procedure Laterality Date  . NO PAST SURGERIES      Family History  Problem Relation Age of Onset  . Diabetes Maternal Aunt   . Heart disease Maternal Aunt   . Diabetes Maternal Grandmother   . Heart disease Maternal Grandmother     Social History   Tobacco Use  . Smoking status: Never Smoker  . Smokeless tobacco: Never Used  Substance Use Topics  . Alcohol use: No  . Drug use: Yes    Frequency: 2.0 times per week    Types: Marijuana    Comment: last used in Feb 2021    Allergies:  Allergies  Allergen Reactions  . Latex Swelling  . Onion Swelling    Medications Prior to Admission  Medication Sig Dispense Refill Last Dose  . acetaminophen (TYLENOL) 325 MG tablet Take 2 tablets (650 mg total) by mouth every 4 (four) hours as needed (for pain scale < 4).  (Patient not taking: Reported on 04/14/2019) 90 tablet 3 Not Taking at Unknown time  . benzocaine-Menthol (DERMOPLAST) 20-0.5 % AERO Apply 1 application topically as needed for irritation (perineal discomfort). (Patient not taking: Reported on 04/14/2019) 1 each 0 Not Taking at Unknown time  . Blood Pressure Monitoring (BLOOD PRESSURE KIT) DEVI 1 kit by Does not apply route as needed. 1 each 0   . medroxyPROGESTERone (DEPO-PROVERA) 150 MG/ML injection Inject 1 mL (150 mg total) into the muscle every 3 (three) months. (Patient not taking: Reported on 11/05/2019) 1 mL 4 Not Taking at Unknown time  . Prenatal Vit-Fe Fumarate-FA (PREPLUS) 27-1 MG TABS Take 1 tablet by mouth daily. (Patient not taking: Reported on 12/08/2019) 30 tablet 13 Not Taking at Unknown time  . witch hazel-glycerin (TUCKS) pad Apply 1 application topically as needed for hemorrhoids. (Patient not taking: Reported on 04/14/2019) 40 each 12 Not Taking at Unknown time    Review of Systems  Gastrointestinal: Positive for abdominal pain and nausea. Negative for vomiting.  Genitourinary: Positive for flank pain, frequency, urgency, vaginal bleeding  and vaginal discharge.  Musculoskeletal: Positive for back pain.   Physical Exam   Blood pressure (!) 77/53, pulse 87, temperature 98.3 F (36.8 C), temperature source Tympanic, resp. rate 16, height 5' 10"  (1.778 m), weight 64.7 kg, last menstrual period 08/31/2019, SpO2 100 %, unknown if currently breastfeeding.  Physical Exam  Constitutional: She is oriented to person, place, and time. She appears well-developed and well-nourished. No distress.  HENT:  Head: Normocephalic and atraumatic.  Eyes: Pupils are equal, round, and reactive to light. Conjunctivae and EOM are normal.  Respiratory: Effort normal. No respiratory distress.  GI: Soft. There is abdominal tenderness.  Genitourinary: Cervix exhibits discharge. Right adnexum displays no tenderness. Left adnexum displays no tenderness.     Vaginal discharge present.   Musculoskeletal:     Cervical back: Normal range of motion and neck supple.  Neurological: She is alert and oriented to person, place, and time.  Skin: Skin is dry. She is not diaphoretic.  Psychiatric: She has a normal mood and affect. Her behavior is normal. Judgment and thought content normal.    MAU Course  Procedures  MDM - HA treated in ED, however still complains of 6/10 pain, will give HA cocktail with benadryl/decadron - Swabs for GC/CT and Wet Prep sent - UA ordered for UTI suspicion - Liver profile added on, BMP and CBC from [facility] unremarkable  Transfer of care to Laury Deep, CNM at [place], DO [facility] 12/08/2019, 8:05 AM  -------------------------------------------------------  Ultrasound ordered due to abdominal pain and pt has not had one previously U/A and wet prep unremarkable Urine culture ordered GC/CT still pending  Assessment and Plan  1. IUP 14w Confirmed with U/S. OB appt scheduled 12/24/2019  2. Tooth Pain Augmentin  Irven Shelling PA-S 12/08/2019, 9:59 AM   Attestation of Supervision of Student:  I confirm that I have verified the information documented in the physician assistant student's note and that I have also personally reperformed the history, physical exam and all medical decision making activities.  I have verified that all services and findings are accurately documented in this student's note; and I agree with management and plan as outlined in the documentation. I have also made any necessary editorial changes.  [place] MFM OB LIMITED  Result Date: 12/08/2019 ----------------------------------------------------------------------  OBSTETRICS REPORT                       (Signed Final 12/08/2019 01:14 pm) ---------------------------------------------------------------------- Patient Info  ID #:       [id number]                          D.O.B.:  11-27-1990 (28 yrs)  Name:        Mandy Vasquez                  Visit Date: 12/08/2019 09:08 am ---------------------------------------------------------------------- Performed By  Performed By:     Enriqueta Shutter           Secondary Phy.:   CHARLES A                    RDMS, RVT  HARPER MD  Attending:        Tama High MD        Address:          [address], [place]                                                             [place], [place]  Referred By:      [facility] MAU/Triage         [facility] ---------------------------------------------------------------------- Orders   #  Description                          Code         Ordered By   1  [place] MFM OB LIMITED                    [id number]     Juliann Olesky  ----------------------------------------------------------------------   #  Order #                    Accession #                 Episode #   1  [id number]                  [id number]                  [id number]  ---------------------------------------------------------------------- Indications   [redacted] weeks gestation of pregnancy                Z3A.17   Abdominal pain in pregnancy (L>R)              O99.89   Size of fetus inconsistent with dates in       O26.842   second trimester  ---------------------------------------------------------------------- Fetal Evaluation  Num Of Fetuses:         1  Fetal Heart Rate(bpm):  144  Cardiac Activity:       Observed  Presentation:           Cephalic  Placenta:  Anterior  Amniotic Fluid  AFI FV:      Subjectively within normal limits                              Largest Pocket(cm)                              3.99 ---------------------------------------------------------------------- Biometry  BPD:      39.2  mm      G. Age:  17w 6d         66  %                                                          FL/BPD:     61.5   %  FL:       24.1  mm     G. Age:  17w 2d         33  % ---------------------------------------------------------------------- OB History  Gravidity:    7         Term:   3        Prem:   1        SAB:   1  TOP:          2       Ectopic:  0        Living: 4 ---------------------------------------------------------------------- Gestational Age  LMP:           14w 1d        Date:  08/31/19                 EDD:   06/06/20  U/S Today:     17w 4d                                        EDD:   05/13/20  Best:          17w 4d     Det. By:  U/S (12/08/19)           EDD:   05/13/20 ---------------------------------------------------------------------- Cervix Uterus Adnexa  Cervix  Length:           3.97  cm.  Normal appearance by transvaginal scan  Uterus  No abnormality visualized. Contraction visualized at area of pain on left.  Left Ovary  Within normal limits.  Right Ovary  Within normal limits.  Cul De Sac  No free fluid seen.  Adnexa  No adnexal mass visualized. ---------------------------------------------------------------------- Impression  Patient was evaluated in the MAU for complaints of  abdominal pain.  A limited ultrasound study was performed.  Amniotic fluid is  normal and good fetal activity seen.  Placenta is anterior and  appears normal.  On transabdominal scan, the cervix is long  and closed (4 cm).  Although the BPD measurement is consistent with [redacted] weeks  gestation, it should not be taken for gestational age  assessment.Marland Kitchen ---------------------------------------------------------------------- Recommendations  -Recommend detailed fetal anatomy scan in 1 to 2 weeks. ----------------------------------------------------------------------                  Tama High, MD Electronically Signed Final Report  12/08/2019 01:14 pm ----------------------------------------------------------------------  Rx  for Augmentin 875 mg BID x 7 days sent to pharmacy on file Keep scheduled appt with [facility]  Laury Deep, [place] Provider, [facility] for [facility], [place] [facility] 12/08/2019 12:24 PM

## 2019-12-08 NOTE — ED Triage Notes (Signed)
Patient reports migraine headache for 3 days and right lower molar pain onset last year , she is [redacted] weeks pregnant G5P4 , denies abdominal contractions or vaginal bleeding .

## 2019-12-09 LAB — CULTURE, OB URINE

## 2019-12-09 LAB — GC/CHLAMYDIA PROBE AMP (~~LOC~~) NOT AT ARMC
Chlamydia: NEGATIVE
Comment: NEGATIVE
Comment: NORMAL
Neisseria Gonorrhea: NEGATIVE

## 2019-12-15 ENCOUNTER — Other Ambulatory Visit: Payer: Self-pay

## 2019-12-15 DIAGNOSIS — B379 Candidiasis, unspecified: Secondary | ICD-10-CM

## 2019-12-15 MED ORDER — TERCONAZOLE 0.8 % VA CREA: 1.0000 | TOPICAL_CREAM | Freq: Every day | VAGINAL | 0 refills | Status: DC

## 2019-12-15 NOTE — Progress Notes (Signed)
Pt called with symptoms of vaginal yeast infection due to being on an antibiotic recently. Terazol sent to pts pharmacy per protocol, pt made aware and voices understanding.

## 2019-12-24 ENCOUNTER — Other Ambulatory Visit (HOSPITAL_COMMUNITY)
Admission: RE | Admit: 2019-12-24 | Discharge: 2019-12-24 | Disposition: A | Discharge status: Home / Self Care | Payer: Medicaid Other | Source: Ambulatory Visit | Attending: Obstetrics | Admitting: Obstetrics

## 2019-12-24 ENCOUNTER — Other Ambulatory Visit: Payer: Self-pay

## 2019-12-24 ENCOUNTER — Encounter: Payer: Self-pay | Admitting: Obstetrics

## 2019-12-24 ENCOUNTER — Ambulatory Visit (INDEPENDENT_AMBULATORY_CARE_PROVIDER_SITE_OTHER): Payer: Medicaid Other | Admitting: Obstetrics

## 2019-12-24 VITALS — BP 105/64 | HR 82 | Wt 139.3 lb

## 2019-12-24 DIAGNOSIS — O09212 Supervision of pregnancy with history of pre-term labor, second trimester: Secondary | ICD-10-CM | POA: Diagnosis not present

## 2019-12-24 DIAGNOSIS — Z3481 Encounter for supervision of other normal pregnancy, first trimester: Secondary | ICD-10-CM | POA: Diagnosis not present

## 2019-12-24 DIAGNOSIS — O09899 Supervision of other high risk pregnancies, unspecified trimester: Secondary | ICD-10-CM

## 2019-12-24 DIAGNOSIS — Z315 Encounter for genetic counseling: Secondary | ICD-10-CM | POA: Diagnosis not present

## 2019-12-24 DIAGNOSIS — Z302 Encounter for sterilization: Secondary | ICD-10-CM

## 2019-12-24 DIAGNOSIS — O09892 Supervision of other high risk pregnancies, second trimester: Secondary | ICD-10-CM | POA: Diagnosis not present

## 2019-12-24 DIAGNOSIS — Z3A19 19 weeks gestation of pregnancy: Secondary | ICD-10-CM | POA: Diagnosis not present

## 2019-12-24 DIAGNOSIS — Z8759 Personal history of other complications of pregnancy, childbirth and the puerperium: Secondary | ICD-10-CM | POA: Diagnosis not present

## 2019-12-24 DIAGNOSIS — Z3482 Encounter for supervision of other normal pregnancy, second trimester: Secondary | ICD-10-CM | POA: Diagnosis not present

## 2019-12-24 MED ORDER — ASPIRIN 81 MG PO CHEW: 81.0000 mg | CHEWABLE_TABLET | Freq: Every day | ORAL | 5 refills | Status: AC

## 2019-12-24 MED ORDER — VITAFOL ULTRA 29-0.6-0.4-200 MG PO CAPS: 1.0000 | ORAL_CAPSULE | Freq: Every day | ORAL | 4 refills | Status: AC

## 2019-12-24 NOTE — Progress Notes (Signed)
Pt is here for initial OB visit. Unsure of LMP, [place] on 12/08/19 gave EDD 05/13/20. Last pap 10/08/2018, normal.

## 2019-12-24 NOTE — Progress Notes (Signed)
Subjective:    Mandy Vasquez is being seen today for her first obstetrical visit.  This is not a planned pregnancy. She is at [redacted]w[redacted]d gestation. Her obstetrical history is significant for intrauterine growth restriction (IUGR) and preterm delivery. Relationship with FOB: significant other, not living together. Patient does intend to breast feed. Pregnancy history fully reviewed.  The information documented in the HPI was reviewed and verified.  Menstrual History: OB History    Gravida  7   Para  4   Term  3   Preterm  1   AB  2   Living  4     SAB  2   TAB      Ectopic      Multiple  0   Live Births  4            Patient's last menstrual period was 08/31/2019.    Past Medical History:  Diagnosis Date  . Anxiety     Past Surgical History:  Procedure Laterality Date  . NO PAST SURGERIES      (Not in a hospital admission)  Allergies  Allergen Reactions  . Latex Swelling  . Onion Swelling    Social History   Tobacco Use  . Smoking status: Never Smoker  . Smokeless tobacco: Never Used  Substance Use Topics  . Alcohol use: No    Family History  Problem Relation Age of Onset  . Diabetes Maternal Aunt   . Heart disease Maternal Aunt   . Diabetes Maternal Grandmother   . Heart disease Maternal Grandmother      Review of Systems Constitutional: negative for weight loss Gastrointestinal: negative for vomiting Genitourinary:negative for genital lesions and vaginal discharge and dysuria Musculoskeletal:negative for back pain Behavioral/Psych: negative for abusive relationship, depression, illegal drug usage and tobacco use    Objective:    BP 105/64   Pulse 82   Wt 139 lb 4.8 oz (63.2 kg)   LMP 08/31/2019   BMI 19.99 kg/m  General Appearance:    Alert, cooperative, no distress, appears stated age  Head:    Normocephalic, without obvious abnormality, atraumatic  Eyes:    PERRL, conjunctiva/corneas clear, EOM's intact, fundi    benign, both eyes   Ears:    Normal TM's and external ear canals, both ears  Nose:   Nares normal, septum midline, mucosa normal, no drainage    or sinus tenderness  Throat:   Lips, mucosa, and tongue normal; teeth and gums normal  Neck:   Supple, symmetrical, trachea midline, no adenopathy;    thyroid:  no enlargement/tenderness/nodules; no carotid   bruit or JVD  Back:     Symmetric, no curvature, ROM normal, no CVA tenderness  Lungs:     Clear to auscultation bilaterally, respirations unlabored  Chest Wall:    No tenderness or deformity   Heart:    Regular rate and rhythm, S1 and S2 normal, no murmur, rub   or gallop  Breast Exam:    No tenderness, masses, or nipple abnormality  Abdomen:     Soft, non-tender, bowel sounds active all four quadrants,    no masses, no organomegaly  Genitalia:    Normal female without lesion, discharge or tenderness  Extremities:   Extremities normal, atraumatic, no cyanosis or edema  Pulses:   2+ and symmetric all extremities  Skin:   Skin color, texture, turgor normal, no rashes or lesions  Lymph nodes:   Cervical, supraclavicular, and axillary nodes normal  Neurologic:  CNII-XII intact, normal strength, sensation and reflexes    throughout      Lab Review Urine pregnancy test Labs reviewed yes Radiologic studies reviewed yes  Assessment:    Pregnancy at [redacted]w[redacted]d weeks    Plan:    .1. Supervision of other high risk pregnancy, antepartum Rx: - Cytology - PAP( [place]) - Obstetric Panel, Including HIV - Genetic Screening - Hepatitis C antibody - [place] MFM OB DETAIL +14 WK; Future  2. History of prior pregnancy with IUGR newborn Rx: - aspirin 81 MG chewable tablet; Chew 1 tablet (81 mg total) by mouth daily.  Dispense: 30 tablet; Refill: 5  3. History of preterm delivery, currently pregnant  4. Request for sterilization Rx: - Prenat-Fe Poly-Methfol-FA-DHA (VITAFOL ULTRA) 29-0.6-0.4-200 MG CAPS; Take 1 capsule by mouth daily before breakfast.  Dispense: 90  capsule; Refill: 4  Prenatal vitamins.  Counseling provided regarding continued use of seat belts, cessation of alcohol consumption, smoking or use of illicit drugs; infection precautions i.e., influenza/TDAP immunizations, toxoplasmosis,CMV, parvovirus, listeria and varicella; workplace safety, exercise during pregnancy; routine dental care, safe medications, sexual activity, hot tubs, saunas, pools, travel, caffeine use, fish and methlymercury, potential toxins, hair treatments, varicose veins Weight gain recommendations per IOM guidelines reviewed: underweight/BMI< 18.5--> gain 28 - 40 lbs; normal weight/BMI 18.5 - 24.9--> gain 25 - 35 lbs; overweight/BMI 25 - 29.9--> gain 15 - 25 lbs; obese/BMI >30->gain  11 - 20 lbs Problem list reviewed and updated. FIRST/CF mutation testing/NIPT/QUAD SCREEN/fragile X/Ashkenazi Jewish population testing/Spinal muscular atrophy discussed: requested. Role of ultrasound in pregnancy discussed; fetal survey: requested. Amniocentesis discussed: not indicated.  Meds ordered this encounter  Medications  . aspirin 81 MG chewable tablet    Sig: Chew 1 tablet (81 mg total) by mouth daily.    Dispense:  30 tablet    Refill:  5  . Prenat-Fe Poly-Methfol-FA-DHA (VITAFOL ULTRA) 29-0.6-0.4-200 MG CAPS    Sig: Take 1 capsule by mouth daily before breakfast.    Dispense:  90 capsule    Refill:  4   Orders Placed This Encounter  Procedures  . [place] MFM OB DETAIL +14 WK    Standing Status:   Future    Standing Expiration Date:   02/22/2021    Order Specific Question:   Reason for Exam (SYMPTOM  OR DIAGNOSIS REQUIRED)    Answer:   Anatomy    Order Specific Question:   Preferred Location    Answer:   [facility]  . Obstetric Panel, Including HIV  . Genetic Screening    [place]  . Hepatitis C antibody    Follow up in 4 weeks. 50% of 25 min visit spent on counseling and coordination of care.    Brock Bad, MD 12/24/2019  3:16 PM

## 2019-12-25 LAB — OBSTETRIC PANEL, INCLUDING HIV
Antibody Screen: NEGATIVE
Basophils Absolute: 0 10*3/uL (ref 0.0–0.2)
Basos: 0 %
EOS (ABSOLUTE): 0.1 10*3/uL (ref 0.0–0.4)
Eos: 1 %
HIV Screen 4th Generation wRfx: NONREACTIVE
Hematocrit: 31.8 % — ABNORMAL LOW (ref 34.0–46.6)
Hemoglobin: 10.5 g/dL — ABNORMAL LOW (ref 11.1–15.9)
Hepatitis B Surface Ag: NEGATIVE
Immature Grans (Abs): 0 10*3/uL (ref 0.0–0.1)
Immature Granulocytes: 1 %
Lymphocytes Absolute: 2.2 10*3/uL (ref 0.7–3.1)
Lymphs: 26 %
MCH: 29 pg (ref 26.6–33.0)
MCHC: 33 g/dL (ref 31.5–35.7)
MCV: 88 fL (ref 79–97)
Monocytes Absolute: 0.4 10*3/uL (ref 0.1–0.9)
Monocytes: 5 %
Neutrophils Absolute: 5.7 10*3/uL (ref 1.4–7.0)
Neutrophils: 67 %
Platelets: 242 10*3/uL (ref 150–450)
RBC: 3.62 x10E6/uL — ABNORMAL LOW (ref 3.77–5.28)
RDW: 12.4 % (ref 11.7–15.4)
RPR Ser Ql: NONREACTIVE
Rh Factor: POSITIVE
Rubella Antibodies, IGG: 1.67 index (ref >0.99)
WBC: 8.4 10*3/uL (ref 3.4–10.8)

## 2019-12-25 LAB — CYTOLOGY - PAP: Diagnosis: NEGATIVE

## 2019-12-25 LAB — HEPATITIS C ANTIBODY: Hep C Virus Ab: <0.1 s/co ratio (ref 0.0–0.9)

## 2019-12-29 ENCOUNTER — Telehealth: Payer: Self-pay

## 2019-12-29 NOTE — Telephone Encounter (Signed)
Attempted to call about mychart messages, call was rejected, unable to leave vm

## 2019-12-30 DIAGNOSIS — Z5181 Encounter for therapeutic drug level monitoring: Secondary | ICD-10-CM | POA: Diagnosis not present

## 2020-01-05 ENCOUNTER — Other Ambulatory Visit: Payer: Self-pay | Admitting: Obstetrics and Gynecology

## 2020-01-05 ENCOUNTER — Encounter: Payer: Self-pay | Admitting: Obstetrics

## 2020-01-05 MED ORDER — BUTALBITAL-APAP-CAFFEINE 50-325-40 MG PO CAPS: 1.0000 | ORAL_CAPSULE | Freq: Four times a day (QID) | ORAL | 3 refills | Status: AC | PRN

## 2020-01-05 MED ORDER — BUTALBITAL-APAP-CAFFEINE 50-325-40 MG PO CAPS: 1.0000 | ORAL_CAPSULE | Freq: Four times a day (QID) | ORAL | 3 refills | Status: DC | PRN

## 2020-01-06 DIAGNOSIS — Z5181 Encounter for therapeutic drug level monitoring: Secondary | ICD-10-CM | POA: Diagnosis not present

## 2020-01-09 ENCOUNTER — Ambulatory Visit (HOSPITAL_COMMUNITY): Payer: Medicaid Other | Admitting: *Deleted

## 2020-01-09 ENCOUNTER — Other Ambulatory Visit (HOSPITAL_COMMUNITY): Payer: Self-pay | Admitting: *Deleted

## 2020-01-09 ENCOUNTER — Other Ambulatory Visit: Payer: Self-pay

## 2020-01-09 ENCOUNTER — Ambulatory Visit (HOSPITAL_COMMUNITY)
Admission: RE | Admit: 2020-01-09 | Discharge: 2020-01-09 | Disposition: A | Discharge status: Home / Self Care | Payer: Medicaid Other | Source: Ambulatory Visit | Attending: Obstetrics and Gynecology | Admitting: Obstetrics and Gynecology

## 2020-01-09 ENCOUNTER — Encounter (HOSPITAL_COMMUNITY): Payer: Self-pay

## 2020-01-09 DIAGNOSIS — Z363 Encounter for antenatal screening for malformations: Secondary | ICD-10-CM | POA: Diagnosis not present

## 2020-01-09 DIAGNOSIS — Z362 Encounter for other antenatal screening follow-up: Secondary | ICD-10-CM

## 2020-01-09 DIAGNOSIS — O09292 Supervision of pregnancy with other poor reproductive or obstetric history, second trimester: Secondary | ICD-10-CM | POA: Diagnosis not present

## 2020-01-09 DIAGNOSIS — Z3A21 21 weeks gestation of pregnancy: Secondary | ICD-10-CM | POA: Diagnosis not present

## 2020-01-09 DIAGNOSIS — O09899 Supervision of other high risk pregnancies, unspecified trimester: Secondary | ICD-10-CM | POA: Diagnosis not present

## 2020-01-09 DIAGNOSIS — Z3490 Encounter for supervision of normal pregnancy, unspecified, unspecified trimester: Secondary | ICD-10-CM | POA: Diagnosis not present

## 2020-01-09 DIAGNOSIS — Z148 Genetic carrier of other disease: Secondary | ICD-10-CM

## 2020-01-09 DIAGNOSIS — O09212 Supervision of pregnancy with history of pre-term labor, second trimester: Secondary | ICD-10-CM | POA: Diagnosis not present

## 2020-01-14 DIAGNOSIS — Z5181 Encounter for therapeutic drug level monitoring: Secondary | ICD-10-CM | POA: Diagnosis not present

## 2020-01-21 ENCOUNTER — Telehealth (INDEPENDENT_AMBULATORY_CARE_PROVIDER_SITE_OTHER): Payer: Medicaid Other | Admitting: Obstetrics and Gynecology

## 2020-01-21 NOTE — Progress Notes (Signed)
Unable to reach patient for virtual visit. 

## 2020-01-28 ENCOUNTER — Telehealth (INDEPENDENT_AMBULATORY_CARE_PROVIDER_SITE_OTHER): Payer: Medicaid Other | Admitting: Obstetrics and Gynecology

## 2020-01-28 ENCOUNTER — Encounter: Payer: Self-pay | Admitting: Obstetrics and Gynecology

## 2020-01-28 DIAGNOSIS — Z3A24 24 weeks gestation of pregnancy: Secondary | ICD-10-CM | POA: Diagnosis not present

## 2020-01-28 DIAGNOSIS — Z8759 Personal history of other complications of pregnancy, childbirth and the puerperium: Secondary | ICD-10-CM

## 2020-01-28 DIAGNOSIS — Z3009 Encounter for other general counseling and advice on contraception: Secondary | ICD-10-CM

## 2020-01-28 DIAGNOSIS — O09292 Supervision of pregnancy with other poor reproductive or obstetric history, second trimester: Secondary | ICD-10-CM

## 2020-01-28 DIAGNOSIS — O09899 Supervision of other high risk pregnancies, unspecified trimester: Secondary | ICD-10-CM

## 2020-01-28 DIAGNOSIS — Z3490 Encounter for supervision of normal pregnancy, unspecified, unspecified trimester: Secondary | ICD-10-CM

## 2020-01-28 NOTE — Progress Notes (Signed)
   TELEHEALTH VIRTUAL OBSTETRICS VISIT ENCOUNTER NOTE  I connected with Mandy Vasquez on 01/28/20 at  8:45 AM EDT by telephone at home and verified that I am speaking with the correct person using two identifiers.   I discussed the limitations, risks, security and privacy concerns of performing an evaluation and management service by telephone and the availability of in person appointments. I also discussed with the patient that there may be a patient responsible charge related to this service. The patient expressed understanding and agreed to proceed.  Subjective:  Mandy Vasquez is a 29 y.o. [id number] at [redacted]w[redacted]d being followed for ongoing prenatal care.  She is currently monitored for the following issues for this high-risk pregnancy and has History of preterm delivery, currently pregnant; Supervision of other high risk pregnancy, antepartum; Intrauterine pregnancy; History of prior pregnancy with IUGR newborn; and Unwanted fertility on their problem list.  Patient reports headache. Reports she has every other day headaches that are not impreoved by fioricet. Reports fetal movement. Denies any contractions, bleeding or leaking of fluid.   The following portions of the patient's history were reviewed and updated as appropriate: allergies, current medications, past family history, past medical history, past social history, past surgical history and problem list.   Objective:   General:  Alert, oriented and cooperative.   Mental Status: Normal mood and affect perceived. Normal judgment and thought content.  Rest of physical exam deferred due to type of encounter  Assessment and Plan:  Pregnancy: [id number] at [redacted]w[redacted]d  1. History of preterm delivery, currently pregnant  2. Supervision of other high risk pregnancy, antepartum Has headaches, not improved with fioricet, will refer to headache specialist  3. History of prior pregnancy with IUGR newborn Has f/u MFM [place]  4. Unwanted fertility Desires  BTL  5. Intrauterine pregnancy   Preterm labor symptoms and general obstetric precautions including but not limited to vaginal bleeding, contractions, leaking of fluid and fetal movement were reviewed in detail with the patient.  I discussed the assessment and treatment plan with the patient. The patient was provided an opportunity to ask questions and all were answered. The patient agreed with the plan and demonstrated an understanding of the instructions. The patient was advised to call back or seek an in-person office evaluation/go to MAU at [facility] for any urgent or concerning symptoms. Please refer to After Visit Summary for other counseling recommendations.   I provided 12 minutes of non-face-to-face time during this encounter.  Return in about 3 weeks (around 02/18/2020) for in person, 2 hr GTT, high OB, 3rd trim labs.  Future Appointments  Date Time Provider Department Center  02/04/2020  2:45 PM [facility] NURSE [facility]  02/04/2020  2:45 PM [facility] US5 [facility]  02/25/2020  9:00 AM [facility] LAB [facility] None  02/25/2020  9:15 AM Malachy Chamber, MD [facility] None    Conan Bowens, MD [facility], [facility]

## 2020-01-28 NOTE — Progress Notes (Signed)
Pt does not have BP cuff today. Pt states she is having a lot of ctx - tight in lower pelvis.  Pt complains of migraines- really no better with meds.

## 2020-02-03 DIAGNOSIS — Z5181 Encounter for therapeutic drug level monitoring: Secondary | ICD-10-CM | POA: Diagnosis not present

## 2020-02-04 ENCOUNTER — Ambulatory Visit (HOSPITAL_COMMUNITY): Payer: Medicaid Other | Attending: Obstetrics and Gynecology

## 2020-02-04 ENCOUNTER — Other Ambulatory Visit: Payer: Self-pay

## 2020-02-04 ENCOUNTER — Ambulatory Visit: Payer: Medicaid Other | Admitting: *Deleted

## 2020-02-04 DIAGNOSIS — Z362 Encounter for other antenatal screening follow-up: Secondary | ICD-10-CM | POA: Diagnosis not present

## 2020-02-04 DIAGNOSIS — O09212 Supervision of pregnancy with history of pre-term labor, second trimester: Secondary | ICD-10-CM

## 2020-02-04 DIAGNOSIS — Z3490 Encounter for supervision of normal pregnancy, unspecified, unspecified trimester: Secondary | ICD-10-CM | POA: Insufficient documentation

## 2020-02-04 DIAGNOSIS — Z148 Genetic carrier of other disease: Secondary | ICD-10-CM

## 2020-02-04 DIAGNOSIS — O09292 Supervision of pregnancy with other poor reproductive or obstetric history, second trimester: Secondary | ICD-10-CM | POA: Diagnosis not present

## 2020-02-04 DIAGNOSIS — Z3A25 25 weeks gestation of pregnancy: Secondary | ICD-10-CM | POA: Diagnosis not present

## 2020-02-06 ENCOUNTER — Other Ambulatory Visit: Payer: Self-pay | Admitting: *Deleted

## 2020-02-06 DIAGNOSIS — O09293 Supervision of pregnancy with other poor reproductive or obstetric history, third trimester: Secondary | ICD-10-CM

## 2020-02-11 DIAGNOSIS — Z5181 Encounter for therapeutic drug level monitoring: Secondary | ICD-10-CM | POA: Diagnosis not present

## 2020-02-18 DIAGNOSIS — Z5181 Encounter for therapeutic drug level monitoring: Secondary | ICD-10-CM | POA: Diagnosis not present

## 2020-02-24 DIAGNOSIS — Z5181 Encounter for therapeutic drug level monitoring: Secondary | ICD-10-CM | POA: Diagnosis not present

## 2020-02-25 ENCOUNTER — Encounter: Payer: Medicaid Other | Admitting: Obstetrics & Gynecology

## 2020-02-25 ENCOUNTER — Other Ambulatory Visit: Payer: Medicaid Other

## 2020-02-25 NOTE — Progress Notes (Signed)
Dental clearance letter sent to pts MyChart

## 2020-03-09 DIAGNOSIS — Z5181 Encounter for therapeutic drug level monitoring: Secondary | ICD-10-CM | POA: Diagnosis not present

## 2020-03-12 ENCOUNTER — Other Ambulatory Visit: Payer: Self-pay

## 2020-03-12 DIAGNOSIS — B379 Candidiasis, unspecified: Secondary | ICD-10-CM

## 2020-03-15 ENCOUNTER — Other Ambulatory Visit: Payer: Self-pay

## 2020-03-15 DIAGNOSIS — R519 Headache, unspecified: Secondary | ICD-10-CM

## 2020-03-23 MED ORDER — TERCONAZOLE 0.8 % VA CREA: 1.0000 | TOPICAL_CREAM | Freq: Every day | VAGINAL | 0 refills | Status: AC

## 2020-03-24 ENCOUNTER — Ambulatory Visit: Payer: Medicaid Other

## 2020-03-31 ENCOUNTER — Other Ambulatory Visit: Payer: Self-pay | Admitting: Obstetrics and Gynecology

## 2020-03-31 ENCOUNTER — Ambulatory Visit: Payer: Medicaid Other | Admitting: *Deleted

## 2020-03-31 ENCOUNTER — Other Ambulatory Visit: Payer: Self-pay

## 2020-03-31 ENCOUNTER — Ambulatory Visit: Payer: Medicaid Other | Attending: Obstetrics and Gynecology

## 2020-03-31 ENCOUNTER — Other Ambulatory Visit: Payer: Self-pay | Admitting: *Deleted

## 2020-03-31 DIAGNOSIS — Z148 Genetic carrier of other disease: Secondary | ICD-10-CM | POA: Diagnosis not present

## 2020-03-31 DIAGNOSIS — O09293 Supervision of pregnancy with other poor reproductive or obstetric history, third trimester: Secondary | ICD-10-CM | POA: Insufficient documentation

## 2020-03-31 DIAGNOSIS — Z3490 Encounter for supervision of normal pregnancy, unspecified, unspecified trimester: Secondary | ICD-10-CM | POA: Insufficient documentation

## 2020-03-31 DIAGNOSIS — Z362 Encounter for other antenatal screening follow-up: Secondary | ICD-10-CM | POA: Diagnosis not present

## 2020-03-31 DIAGNOSIS — Z3A33 33 weeks gestation of pregnancy: Secondary | ICD-10-CM | POA: Diagnosis not present

## 2020-03-31 DIAGNOSIS — O365930 Maternal care for other known or suspected poor fetal growth, third trimester, not applicable or unspecified: Secondary | ICD-10-CM | POA: Diagnosis not present

## 2020-03-31 DIAGNOSIS — Z8751 Personal history of pre-term labor: Secondary | ICD-10-CM

## 2020-04-01 ENCOUNTER — Ambulatory Visit: Payer: Self-pay | Admitting: Neurology

## 2020-04-01 ENCOUNTER — Encounter: Payer: Self-pay | Admitting: Neurology

## 2020-04-07 ENCOUNTER — Ambulatory Visit: Payer: Medicaid Other | Attending: Obstetrics and Gynecology

## 2020-04-07 ENCOUNTER — Ambulatory Visit: Payer: Medicaid Other

## 2020-04-07 ENCOUNTER — Other Ambulatory Visit: Payer: Self-pay

## 2020-04-14 ENCOUNTER — Ambulatory Visit (INDEPENDENT_AMBULATORY_CARE_PROVIDER_SITE_OTHER): Payer: Medicaid Other | Admitting: Obstetrics & Gynecology

## 2020-04-14 ENCOUNTER — Ambulatory Visit: Payer: Medicaid Other | Attending: Obstetrics and Gynecology

## 2020-04-14 ENCOUNTER — Ambulatory Visit: Payer: Medicaid Other | Admitting: *Deleted

## 2020-04-14 ENCOUNTER — Other Ambulatory Visit: Payer: Self-pay

## 2020-04-14 ENCOUNTER — Encounter: Payer: Self-pay | Admitting: Obstetrics & Gynecology

## 2020-04-14 ENCOUNTER — Other Ambulatory Visit (HOSPITAL_COMMUNITY)
Admission: RE | Admit: 2020-04-14 | Discharge: 2020-04-14 | Disposition: A | Discharge status: Home / Self Care | Payer: Medicaid Other | Source: Ambulatory Visit | Attending: Obstetrics & Gynecology | Admitting: Obstetrics & Gynecology

## 2020-04-14 VITALS — BP 107/71 | HR 89

## 2020-04-14 VITALS — BP 114/72 | HR 80 | Wt 154.0 lb

## 2020-04-14 DIAGNOSIS — O0990 Supervision of high risk pregnancy, unspecified, unspecified trimester: Secondary | ICD-10-CM | POA: Diagnosis not present

## 2020-04-14 DIAGNOSIS — O09213 Supervision of pregnancy with history of pre-term labor, third trimester: Secondary | ICD-10-CM

## 2020-04-14 DIAGNOSIS — O09899 Supervision of other high risk pregnancies, unspecified trimester: Secondary | ICD-10-CM | POA: Insufficient documentation

## 2020-04-14 DIAGNOSIS — Z8751 Personal history of pre-term labor: Secondary | ICD-10-CM | POA: Insufficient documentation

## 2020-04-14 DIAGNOSIS — Z8759 Personal history of other complications of pregnancy, childbirth and the puerperium: Secondary | ICD-10-CM

## 2020-04-14 DIAGNOSIS — Z3A35 35 weeks gestation of pregnancy: Secondary | ICD-10-CM

## 2020-04-14 DIAGNOSIS — O09293 Supervision of pregnancy with other poor reproductive or obstetric history, third trimester: Secondary | ICD-10-CM | POA: Diagnosis not present

## 2020-04-14 DIAGNOSIS — O365930 Maternal care for other known or suspected poor fetal growth, third trimester, not applicable or unspecified: Secondary | ICD-10-CM

## 2020-04-14 DIAGNOSIS — Z362 Encounter for other antenatal screening follow-up: Secondary | ICD-10-CM | POA: Diagnosis not present

## 2020-04-14 DIAGNOSIS — Z3490 Encounter for supervision of normal pregnancy, unspecified, unspecified trimester: Secondary | ICD-10-CM | POA: Insufficient documentation

## 2020-04-14 DIAGNOSIS — Z148 Genetic carrier of other disease: Secondary | ICD-10-CM

## 2020-04-14 DIAGNOSIS — O09893 Supervision of other high risk pregnancies, third trimester: Secondary | ICD-10-CM

## 2020-04-14 NOTE — Progress Notes (Signed)
   PRENATAL VISIT NOTE  Subjective:  Mandy Vasquez is a 29 y.o. [id number] at [redacted]w[redacted]d being seen today for ongoing prenatal care.  She is currently monitored for the following issues for this high-risk pregnancy and has History of preterm delivery, currently pregnant; Supervision of other high risk pregnancy, antepartum; Intrauterine pregnancy; History of prior pregnancy with IUGR newborn; and Unwanted fertility on their problem list.  Patient reports occasional contractions.  Contractions: Irritability. Vag. Bleeding: None.  Movement: Present. Denies leaking of fluid.   The following portions of the patient's history were reviewed and updated as appropriate: allergies, current medications, past family history, past medical history, past social history, past surgical history and problem list.   Objective:   Vitals:   04/14/20 1438  BP: 114/72  Pulse: 80  Weight: 154 lb (69.9 kg)    Fetal Status: Fetal Heart Rate (bpm): 140   Movement: Present  Presentation: Vertex  General:  Alert, oriented and cooperative. Patient is in no acute distress.  Skin: Skin is warm and dry. No rash noted.   Cardiovascular: Normal heart rate noted  Respiratory: Normal respiratory effort, no problems with respiration noted  Abdomen: Soft, gravid, appropriate for gestational age.  Pain/Pressure: Absent     Pelvic: Cervical exam performed in the presence of a chaperone Dilation: 1.5 Effacement (%): 50 Station: -3  Extremities: Normal range of motion.  Edema: None  Mental Status: Normal mood and affect. Normal behavior. Normal judgment and thought content.   Assessment and Plan:  Pregnancy: [id number] at [redacted]w[redacted]d 1. Supervision of other high risk pregnancy, antepartum Non compliance, will r/s 2 hr GTT  2. History of prior pregnancy with IUGR newborn Current IUGR, BPP 8/8 today  Preterm labor symptoms and general obstetric precautions including but not limited to vaginal bleeding, contractions, leaking of fluid and  fetal movement were reviewed in detail with the patient. Please refer to After Visit Summary for other counseling recommendations.   Return in about 1 week (around 04/21/2020) for 2 hr .  Future Appointments  Date Time Provider Department Center  04/21/2020 10:45 AM [facility] NURSE [facility]  04/21/2020 11:00 AM [facility] US1 [facility]  04/28/2020 10:45 AM [facility] NURSE [facility]  04/28/2020 11:00 AM [facility] US1 [facility]    Scheryl Darter, MD

## 2020-04-14 NOTE — Patient Instructions (Signed)

## 2020-04-14 NOTE — Progress Notes (Signed)
Pt not seen since May 2 gtt not done - pt refused BPP completed today - IUGR

## 2020-04-15 LAB — CERVICOVAGINAL ANCILLARY ONLY
Chlamydia: NEGATIVE
Comment: NEGATIVE
Comment: NORMAL
Neisseria Gonorrhea: NEGATIVE

## 2020-04-18 LAB — CULTURE, BETA STREP (GROUP B ONLY): Strep Gp B Culture: POSITIVE — AB

## 2020-04-21 ENCOUNTER — Ambulatory Visit: Payer: Medicaid Other | Admitting: *Deleted

## 2020-04-21 ENCOUNTER — Encounter: Payer: Self-pay | Admitting: *Deleted

## 2020-04-21 ENCOUNTER — Encounter: Payer: Medicaid Other | Admitting: Obstetrics and Gynecology

## 2020-04-21 ENCOUNTER — Ambulatory Visit: Payer: Medicaid Other | Attending: Obstetrics and Gynecology

## 2020-04-21 ENCOUNTER — Other Ambulatory Visit: Payer: Medicaid Other

## 2020-04-21 ENCOUNTER — Other Ambulatory Visit: Payer: Self-pay

## 2020-04-21 DIAGNOSIS — Z148 Genetic carrier of other disease: Secondary | ICD-10-CM

## 2020-04-21 DIAGNOSIS — O09293 Supervision of pregnancy with other poor reproductive or obstetric history, third trimester: Secondary | ICD-10-CM

## 2020-04-21 DIAGNOSIS — Z8759 Personal history of other complications of pregnancy, childbirth and the puerperium: Secondary | ICD-10-CM

## 2020-04-21 DIAGNOSIS — Z8751 Personal history of pre-term labor: Secondary | ICD-10-CM | POA: Insufficient documentation

## 2020-04-21 DIAGNOSIS — O09213 Supervision of pregnancy with history of pre-term labor, third trimester: Secondary | ICD-10-CM | POA: Diagnosis not present

## 2020-04-21 DIAGNOSIS — O365930 Maternal care for other known or suspected poor fetal growth, third trimester, not applicable or unspecified: Secondary | ICD-10-CM | POA: Diagnosis not present

## 2020-04-21 DIAGNOSIS — Z3490 Encounter for supervision of normal pregnancy, unspecified, unspecified trimester: Secondary | ICD-10-CM | POA: Insufficient documentation

## 2020-04-22 ENCOUNTER — Encounter: Payer: Medicaid Other | Admitting: Obstetrics & Gynecology

## 2020-04-22 DIAGNOSIS — Z3A36 36 weeks gestation of pregnancy: Secondary | ICD-10-CM | POA: Diagnosis not present

## 2020-04-22 DIAGNOSIS — O99824 Streptococcus B carrier state complicating childbirth: Secondary | ICD-10-CM | POA: Diagnosis not present

## 2020-04-22 DIAGNOSIS — Z91018 Allergy to other foods: Secondary | ICD-10-CM | POA: Diagnosis not present

## 2020-04-22 DIAGNOSIS — Z91040 Latex allergy status: Secondary | ICD-10-CM | POA: Diagnosis not present

## 2020-04-22 DIAGNOSIS — Z8759 Personal history of other complications of pregnancy, childbirth and the puerperium: Secondary | ICD-10-CM | POA: Diagnosis not present

## 2020-04-28 ENCOUNTER — Ambulatory Visit: Payer: Medicaid Other

## 2020-05-26 ENCOUNTER — Ambulatory Visit: Payer: Medicaid Other | Admitting: Obstetrics and Gynecology

## 2020-06-30 DIAGNOSIS — F4323 Adjustment disorder with mixed anxiety and depressed mood: Secondary | ICD-10-CM | POA: Diagnosis not present

## 2020-07-06 DIAGNOSIS — F4323 Adjustment disorder with mixed anxiety and depressed mood: Secondary | ICD-10-CM | POA: Diagnosis not present

## 2021-01-05 DIAGNOSIS — Z5181 Encounter for therapeutic drug level monitoring: Secondary | ICD-10-CM | POA: Diagnosis not present

## 2021-01-08 DIAGNOSIS — S0990XA Unspecified injury of head, initial encounter: Secondary | ICD-10-CM | POA: Diagnosis not present

## 2021-01-08 DIAGNOSIS — S199XXA Unspecified injury of neck, initial encounter: Secondary | ICD-10-CM | POA: Diagnosis not present

## 2021-01-08 DIAGNOSIS — W01198A Fall on same level from slipping, tripping and stumbling with subsequent striking against other object, initial encounter: Secondary | ICD-10-CM | POA: Diagnosis not present

## 2021-01-08 DIAGNOSIS — R1110 Vomiting, unspecified: Secondary | ICD-10-CM | POA: Diagnosis not present

## 2021-01-08 DIAGNOSIS — S060X9A Concussion with loss of consciousness of unspecified duration, initial encounter: Secondary | ICD-10-CM | POA: Diagnosis not present

## 2021-01-08 DIAGNOSIS — Y999 Unspecified external cause status: Secondary | ICD-10-CM | POA: Diagnosis not present

## 2021-01-08 DIAGNOSIS — M546 Pain in thoracic spine: Secondary | ICD-10-CM | POA: Diagnosis not present

## 2021-01-19 DIAGNOSIS — Z91040 Latex allergy status: Secondary | ICD-10-CM | POA: Diagnosis not present

## 2021-01-19 DIAGNOSIS — Z5181 Encounter for therapeutic drug level monitoring: Secondary | ICD-10-CM | POA: Diagnosis not present

## 2021-01-19 DIAGNOSIS — M79605 Pain in left leg: Secondary | ICD-10-CM | POA: Diagnosis not present

## 2021-01-19 DIAGNOSIS — Z91018 Allergy to other foods: Secondary | ICD-10-CM | POA: Diagnosis not present

## 2021-01-19 DIAGNOSIS — L03116 Cellulitis of left lower limb: Secondary | ICD-10-CM | POA: Diagnosis not present

## 2021-01-19 DIAGNOSIS — F17200 Nicotine dependence, unspecified, uncomplicated: Secondary | ICD-10-CM | POA: Diagnosis not present

## 2021-01-19 DIAGNOSIS — M79662 Pain in left lower leg: Secondary | ICD-10-CM | POA: Diagnosis not present

## 2022-01-02 DIAGNOSIS — R0789 Other chest pain: Secondary | ICD-10-CM | POA: Diagnosis not present

## 2022-01-02 DIAGNOSIS — M7989 Other specified soft tissue disorders: Secondary | ICD-10-CM | POA: Diagnosis not present

## 2022-01-02 DIAGNOSIS — M25571 Pain in right ankle and joints of right foot: Secondary | ICD-10-CM | POA: Diagnosis not present

## 2022-01-02 DIAGNOSIS — M79604 Pain in right leg: Secondary | ICD-10-CM | POA: Diagnosis not present

## 2022-01-02 DIAGNOSIS — R079 Chest pain, unspecified: Secondary | ICD-10-CM | POA: Diagnosis not present

## 2022-01-04 DIAGNOSIS — I493 Ventricular premature depolarization: Secondary | ICD-10-CM | POA: Diagnosis not present

## 2022-01-04 DIAGNOSIS — I498 Other specified cardiac arrhythmias: Secondary | ICD-10-CM | POA: Diagnosis not present

## 2022-03-26 DIAGNOSIS — S39012D Strain of muscle, fascia and tendon of lower back, subsequent encounter: Secondary | ICD-10-CM | POA: Diagnosis not present

## 2022-03-26 DIAGNOSIS — R296 Repeated falls: Secondary | ICD-10-CM | POA: Diagnosis not present

## 2022-03-26 DIAGNOSIS — S300XXA Contusion of lower back and pelvis, initial encounter: Secondary | ICD-10-CM | POA: Diagnosis not present

## 2022-03-26 DIAGNOSIS — K047 Periapical abscess without sinus: Secondary | ICD-10-CM | POA: Diagnosis not present

## 2023-07-20 DIAGNOSIS — B9689 Other specified bacterial agents as the cause of diseases classified elsewhere: Secondary | ICD-10-CM | POA: Diagnosis not present

## 2023-07-20 DIAGNOSIS — A599 Trichomoniasis, unspecified: Secondary | ICD-10-CM | POA: Diagnosis not present

## 2023-07-20 DIAGNOSIS — N760 Acute vaginitis: Secondary | ICD-10-CM | POA: Diagnosis not present

## 2023-07-20 DIAGNOSIS — Z91040 Latex allergy status: Secondary | ICD-10-CM | POA: Diagnosis not present

## 2023-07-20 DIAGNOSIS — A5901 Trichomonal vulvovaginitis: Secondary | ICD-10-CM | POA: Diagnosis not present

## 2024-05-15 DIAGNOSIS — Z3202 Encounter for pregnancy test, result negative: Secondary | ICD-10-CM | POA: Diagnosis not present

## 2024-05-15 DIAGNOSIS — Z113 Encounter for screening for infections with a predominantly sexual mode of transmission: Secondary | ICD-10-CM | POA: Diagnosis not present

## 2024-05-15 DIAGNOSIS — Z202 Contact with and (suspected) exposure to infections with a predominantly sexual mode of transmission: Secondary | ICD-10-CM | POA: Diagnosis not present

## 2024-05-15 DIAGNOSIS — N760 Acute vaginitis: Secondary | ICD-10-CM | POA: Diagnosis not present

## 2024-06-09 DIAGNOSIS — Z124 Encounter for screening for malignant neoplasm of cervix: Secondary | ICD-10-CM | POA: Diagnosis not present

## 2024-06-09 DIAGNOSIS — Z30013 Encounter for initial prescription of injectable contraceptive: Secondary | ICD-10-CM | POA: Diagnosis not present

## 2024-06-09 DIAGNOSIS — Z113 Encounter for screening for infections with a predominantly sexual mode of transmission: Secondary | ICD-10-CM | POA: Diagnosis not present

## 2024-06-09 DIAGNOSIS — Z6824 Body mass index (BMI) 24.0-24.9, adult: Secondary | ICD-10-CM | POA: Diagnosis not present

## 2024-06-09 DIAGNOSIS — Z0000 Encounter for general adult medical examination without abnormal findings: Secondary | ICD-10-CM | POA: Diagnosis not present
